# Patient Record
Sex: Male | Born: 1975 | Hispanic: No | Smoking: Current every day smoker
Health system: Southern US, Community
[De-identification: ages and names within clinical notes are randomized; demographics above are authoritative.]

## PROBLEM LIST (undated history)

## (undated) DIAGNOSIS — K2210 Ulcer of esophagus without bleeding: Secondary | ICD-10-CM

## (undated) DIAGNOSIS — M2550 Pain in unspecified joint: Secondary | ICD-10-CM

## (undated) DIAGNOSIS — E785 Hyperlipidemia, unspecified: Secondary | ICD-10-CM

## (undated) DIAGNOSIS — I10 Essential (primary) hypertension: Secondary | ICD-10-CM

## (undated) DIAGNOSIS — K219 Gastro-esophageal reflux disease without esophagitis: Secondary | ICD-10-CM

## (undated) DIAGNOSIS — A4902 Methicillin resistant Staphylococcus aureus infection, unspecified site: Secondary | ICD-10-CM

## (undated) DIAGNOSIS — M549 Dorsalgia, unspecified: Secondary | ICD-10-CM

## (undated) HISTORY — PX: SHOULDER SURGERY: SHX246

## (undated) HISTORY — PX: KNEE SURGERY: SHX244

## (undated) HISTORY — DX: Pain in unspecified joint: M25.50

## (undated) HISTORY — DX: Methicillin resistant Staphylococcus aureus infection, unspecified site: A49.02

## (undated) HISTORY — DX: Dorsalgia, unspecified: M54.9

## (undated) HISTORY — DX: Hyperlipidemia, unspecified: E78.5

## (undated) HISTORY — DX: Gastro-esophageal reflux disease without esophagitis: K21.9

---

## 2005-09-03 ENCOUNTER — Emergency Department (HOSPITAL_COMMUNITY): Admission: EM | Admit: 2005-09-03 | Discharge: 2005-09-04 | Payer: Self-pay | Admitting: Emergency Medicine

## 2005-11-16 ENCOUNTER — Emergency Department (HOSPITAL_COMMUNITY): Admission: EM | Admit: 2005-11-16 | Discharge: 2005-11-16 | Payer: Self-pay | Admitting: Emergency Medicine

## 2005-11-17 ENCOUNTER — Ambulatory Visit (HOSPITAL_COMMUNITY): Admission: RE | Admit: 2005-11-17 | Discharge: 2005-11-17 | Payer: Self-pay | Admitting: Emergency Medicine

## 2005-12-01 ENCOUNTER — Emergency Department (HOSPITAL_COMMUNITY): Admission: EM | Admit: 2005-12-01 | Discharge: 2005-12-01 | Payer: Self-pay | Admitting: Emergency Medicine

## 2005-12-19 ENCOUNTER — Emergency Department (HOSPITAL_COMMUNITY): Admission: EM | Admit: 2005-12-19 | Discharge: 2005-12-19 | Payer: Self-pay | Admitting: Emergency Medicine

## 2006-02-12 ENCOUNTER — Ambulatory Visit: Payer: Self-pay | Admitting: Internal Medicine

## 2006-02-12 ENCOUNTER — Observation Stay (HOSPITAL_COMMUNITY): Admission: EM | Admit: 2006-02-12 | Discharge: 2006-02-13 | Payer: Self-pay | Admitting: Emergency Medicine

## 2006-02-16 ENCOUNTER — Emergency Department (HOSPITAL_COMMUNITY): Admission: EM | Admit: 2006-02-16 | Discharge: 2006-02-16 | Payer: Self-pay | Admitting: Emergency Medicine

## 2006-02-24 ENCOUNTER — Emergency Department (HOSPITAL_COMMUNITY): Admission: EM | Admit: 2006-02-24 | Discharge: 2006-02-24 | Payer: Self-pay | Admitting: Emergency Medicine

## 2006-04-18 ENCOUNTER — Emergency Department (HOSPITAL_COMMUNITY): Admission: EM | Admit: 2006-04-18 | Discharge: 2006-04-18 | Payer: Self-pay | Admitting: Emergency Medicine

## 2006-05-09 ENCOUNTER — Emergency Department (HOSPITAL_COMMUNITY): Admission: EM | Admit: 2006-05-09 | Discharge: 2006-05-09 | Payer: Self-pay | Admitting: Emergency Medicine

## 2006-10-19 ENCOUNTER — Emergency Department (HOSPITAL_COMMUNITY): Admission: EM | Admit: 2006-10-19 | Discharge: 2006-10-19 | Payer: Self-pay | Admitting: Emergency Medicine

## 2007-01-03 ENCOUNTER — Emergency Department (HOSPITAL_COMMUNITY): Admission: EM | Admit: 2007-01-03 | Discharge: 2007-01-03 | Payer: Self-pay | Admitting: Emergency Medicine

## 2007-01-12 ENCOUNTER — Emergency Department (HOSPITAL_COMMUNITY): Admission: EM | Admit: 2007-01-12 | Discharge: 2007-01-13 | Payer: Self-pay | Admitting: Emergency Medicine

## 2007-04-20 ENCOUNTER — Emergency Department (HOSPITAL_COMMUNITY): Admission: EM | Admit: 2007-04-20 | Discharge: 2007-04-21 | Payer: Self-pay | Admitting: Emergency Medicine

## 2007-06-11 ENCOUNTER — Emergency Department (HOSPITAL_COMMUNITY): Admission: EM | Admit: 2007-06-11 | Discharge: 2007-06-11 | Payer: Self-pay | Admitting: Emergency Medicine

## 2007-10-10 ENCOUNTER — Emergency Department (HOSPITAL_COMMUNITY): Admission: EM | Admit: 2007-10-10 | Discharge: 2007-10-11 | Payer: Self-pay | Admitting: Emergency Medicine

## 2008-02-29 ENCOUNTER — Emergency Department (HOSPITAL_COMMUNITY): Admission: EM | Admit: 2008-02-29 | Discharge: 2008-02-29 | Payer: Self-pay | Admitting: Emergency Medicine

## 2008-07-29 ENCOUNTER — Emergency Department (HOSPITAL_COMMUNITY): Admission: EM | Admit: 2008-07-29 | Discharge: 2008-07-29 | Payer: Self-pay | Admitting: Emergency Medicine

## 2008-12-11 ENCOUNTER — Emergency Department (HOSPITAL_COMMUNITY): Admission: EM | Admit: 2008-12-11 | Discharge: 2008-12-11 | Payer: Self-pay | Admitting: Emergency Medicine

## 2009-03-24 ENCOUNTER — Emergency Department (HOSPITAL_COMMUNITY): Admission: EM | Admit: 2009-03-24 | Discharge: 2009-03-24 | Payer: Self-pay | Admitting: Emergency Medicine

## 2009-03-25 ENCOUNTER — Inpatient Hospital Stay (HOSPITAL_COMMUNITY): Admission: EM | Admit: 2009-03-25 | Discharge: 2009-03-26 | Payer: Self-pay | Admitting: Emergency Medicine

## 2009-04-16 ENCOUNTER — Emergency Department (HOSPITAL_COMMUNITY): Admission: EM | Admit: 2009-04-16 | Discharge: 2009-04-16 | Payer: Self-pay | Admitting: Emergency Medicine

## 2009-04-18 ENCOUNTER — Emergency Department (HOSPITAL_COMMUNITY): Admission: EM | Admit: 2009-04-18 | Discharge: 2009-04-18 | Payer: Self-pay | Admitting: Emergency Medicine

## 2009-04-20 ENCOUNTER — Emergency Department (HOSPITAL_COMMUNITY): Admission: EM | Admit: 2009-04-20 | Discharge: 2009-04-20 | Payer: Self-pay | Admitting: Emergency Medicine

## 2009-05-27 ENCOUNTER — Inpatient Hospital Stay (HOSPITAL_COMMUNITY): Admission: EM | Admit: 2009-05-27 | Discharge: 2009-05-28 | Payer: Self-pay | Admitting: Emergency Medicine

## 2009-05-27 ENCOUNTER — Ambulatory Visit: Payer: Self-pay | Admitting: Gastroenterology

## 2009-05-28 ENCOUNTER — Ambulatory Visit: Payer: Self-pay | Admitting: Gastroenterology

## 2009-06-27 ENCOUNTER — Encounter: Payer: Self-pay | Admitting: Gastroenterology

## 2009-07-27 DIAGNOSIS — J45909 Unspecified asthma, uncomplicated: Secondary | ICD-10-CM | POA: Insufficient documentation

## 2009-07-27 DIAGNOSIS — Z8719 Personal history of other diseases of the digestive system: Secondary | ICD-10-CM

## 2009-07-27 DIAGNOSIS — K219 Gastro-esophageal reflux disease without esophagitis: Secondary | ICD-10-CM

## 2009-07-27 DIAGNOSIS — K208 Other esophagitis: Secondary | ICD-10-CM

## 2009-07-27 DIAGNOSIS — Z9189 Other specified personal risk factors, not elsewhere classified: Secondary | ICD-10-CM | POA: Insufficient documentation

## 2009-08-27 ENCOUNTER — Emergency Department (HOSPITAL_COMMUNITY): Admission: EM | Admit: 2009-08-27 | Discharge: 2009-08-27 | Payer: Self-pay | Admitting: Emergency Medicine

## 2010-12-04 ENCOUNTER — Emergency Department (HOSPITAL_COMMUNITY): Payer: Medicaid Other

## 2010-12-04 ENCOUNTER — Emergency Department (HOSPITAL_COMMUNITY)
Admission: EM | Admit: 2010-12-04 | Discharge: 2010-12-04 | Disposition: A | Discharge status: Home / Self Care | Payer: Medicaid Other | Attending: Emergency Medicine | Admitting: Emergency Medicine

## 2010-12-04 DIAGNOSIS — R509 Fever, unspecified: Secondary | ICD-10-CM | POA: Insufficient documentation

## 2010-12-04 DIAGNOSIS — R002 Palpitations: Secondary | ICD-10-CM | POA: Insufficient documentation

## 2010-12-04 LAB — BASIC METABOLIC PANEL
BUN: 10 mg/dL (ref 6–23)
CO2: 25 mEq/L (ref 19–32)
Calcium: 9.7 mg/dL (ref 8.4–10.5)
GFR calc Af Amer: >60 mL/min (ref >60)
GFR calc non Af Amer: >60 mL/min (ref >60)
Glucose, Bld: 142 mg/dL — ABNORMAL HIGH (ref 70–99)
Potassium: 3.8 mEq/L (ref 3.5–5.1)
Sodium: 135 mEq/L (ref 135–145)

## 2010-12-04 LAB — CBC
HCT: 40.9 % (ref 39.0–52.0)
Hemoglobin: 14.3 g/dL (ref 13.0–17.0)
MCH: 27.6 pg (ref 26.0–34.0)
MCHC: 35 g/dL (ref 30.0–36.0)
MCV: 79 fL (ref 78.0–100.0)
RBC: 5.18 MIL/uL (ref 4.22–5.81)
RDW: 13.9 % (ref 11.5–15.5)
WBC: 6 10*3/uL (ref 4.0–10.5)

## 2010-12-04 LAB — DIFFERENTIAL
Basophils Absolute: 0 10*3/uL (ref 0.0–0.1)
Eosinophils Relative: 2 % (ref 0–5)
Lymphocytes Relative: 46 % (ref 12–46)
Lymphs Abs: 2.8 10*3/uL (ref 0.7–4.0)
Monocytes Absolute: 0.5 10*3/uL (ref 0.1–1.0)
Monocytes Relative: 9 % (ref 3–12)
Neutro Abs: 2.6 10*3/uL (ref 1.7–7.7)
Neutrophils Relative %: 44 % (ref 43–77)

## 2010-12-04 LAB — POCT CARDIAC MARKERS
CKMB, poc: 1.3 ng/mL (ref 1.0–8.0)
Troponin i, poc: <0.05 ng/mL (ref 0.00–0.09)

## 2010-12-10 LAB — URINALYSIS, ROUTINE W REFLEX MICROSCOPIC
Glucose, UA: NEGATIVE mg/dL
Hgb urine dipstick: NEGATIVE
Ketones, ur: NEGATIVE mg/dL
Nitrite: NEGATIVE
Protein, ur: NEGATIVE mg/dL
Specific Gravity, Urine: 1.02 (ref 1.005–1.030)
Urobilinogen, UA: 0.2 mg/dL (ref 0.0–1.0)
pH: 5.5 (ref 5.0–8.0)

## 2010-12-14 LAB — CBC
HCT: 36.3 % — ABNORMAL LOW (ref 39.0–52.0)
Hemoglobin: 12.9 g/dL — ABNORMAL LOW (ref 13.0–17.0)
Hemoglobin: 13.3 g/dL (ref 13.0–17.0)
MCHC: 35.5 g/dL (ref 30.0–36.0)
MCV: 82.9 fL (ref 78.0–100.0)
Platelets: 191 10*3/uL (ref 150–400)
RBC: 4.37 MIL/uL (ref 4.22–5.81)
RBC: 4.55 MIL/uL (ref 4.22–5.81)
RDW: 14.2 % (ref 11.5–15.5)
WBC: 5.9 10*3/uL (ref 4.0–10.5)
WBC: 7 10*3/uL (ref 4.0–10.5)

## 2010-12-14 LAB — HEPATIC FUNCTION PANEL
ALT: 39 U/L (ref 0–53)
AST: 17 U/L (ref 0–37)
Albumin: 3.8 g/dL (ref 3.5–5.2)
Alkaline Phosphatase: 62 U/L (ref 39–117)
Bilirubin, Direct: <0.1 mg/dL (ref 0.0–0.3)
Total Bilirubin: 0.4 mg/dL (ref 0.3–1.2)
Total Protein: 6.6 g/dL (ref 6.0–8.3)

## 2010-12-14 LAB — DIFFERENTIAL
Basophils Absolute: 0 10*3/uL (ref 0.0–0.1)
Basophils Relative: 0 % (ref 0–1)
Basophils Relative: 1 % (ref 0–1)
Eosinophils Absolute: 0.1 10*3/uL (ref 0.0–0.7)
Eosinophils Absolute: 0.1 10*3/uL (ref 0.0–0.7)
Eosinophils Relative: 1 % (ref 0–5)
Eosinophils Relative: 2 % (ref 0–5)
Lymphocytes Relative: 36 % (ref 12–46)
Lymphs Abs: 2.1 10*3/uL (ref 0.7–4.0)
Monocytes Absolute: 0.5 10*3/uL (ref 0.1–1.0)
Monocytes Relative: 5 % (ref 3–12)
Monocytes Relative: 9 % (ref 3–12)
Neutro Abs: 3.1 10*3/uL (ref 1.7–7.7)
Neutrophils Relative %: 53 % (ref 43–77)
Neutrophils Relative %: 63 % (ref 43–77)

## 2010-12-14 LAB — COMPREHENSIVE METABOLIC PANEL
ALT: 38 U/L (ref 0–53)
Alkaline Phosphatase: 61 U/L (ref 39–117)
CO2: 28 mEq/L (ref 19–32)
GFR calc non Af Amer: >60 mL/min (ref >60)
Glucose, Bld: 100 mg/dL — ABNORMAL HIGH (ref 70–99)
Potassium: 4 mEq/L (ref 3.5–5.1)
Sodium: 137 mEq/L (ref 135–145)

## 2010-12-14 LAB — BASIC METABOLIC PANEL
BUN: 11 mg/dL (ref 6–23)
CO2: 29 mEq/L (ref 19–32)
Calcium: 9.4 mg/dL (ref 8.4–10.5)
Chloride: 103 mEq/L (ref 96–112)
Creatinine, Ser: 0.63 mg/dL (ref 0.4–1.5)
GFR calc Af Amer: >60 mL/min (ref >60)
GFR calc non Af Amer: >60 mL/min (ref >60)
Glucose, Bld: 100 mg/dL — ABNORMAL HIGH (ref 70–99)
Potassium: 3.5 mEq/L (ref 3.5–5.1)
Sodium: 138 mEq/L (ref 135–145)

## 2010-12-14 LAB — HEMOGLOBIN AND HEMATOCRIT, BLOOD
HCT: 35.5 % — ABNORMAL LOW (ref 39.0–52.0)
Hemoglobin: 12.3 g/dL — ABNORMAL LOW (ref 13.0–17.0)

## 2010-12-14 LAB — APTT: aPTT: 31 seconds (ref 24–37)

## 2010-12-14 LAB — PROTIME-INR
INR: 0.8 (ref 0.00–1.49)
Prothrombin Time: 11.3 seconds — ABNORMAL LOW (ref 11.6–15.2)

## 2010-12-14 LAB — TYPE AND SCREEN: ABO/RH(D): O POS

## 2010-12-15 LAB — CULTURE, ROUTINE-ABSCESS

## 2010-12-16 LAB — DIFFERENTIAL
Basophils Absolute: 0 10*3/uL (ref 0.0–0.1)
Basophils Relative: 0 % (ref 0–1)
Eosinophils Absolute: 0 10*3/uL (ref 0.0–0.7)
Eosinophils Absolute: 0.1 10*3/uL (ref 0.0–0.7)
Eosinophils Relative: 2 % (ref 0–5)
Lymphs Abs: 2.7 10*3/uL (ref 0.7–4.0)
Monocytes Absolute: 0.5 10*3/uL (ref 0.1–1.0)
Monocytes Relative: 8 % (ref 3–12)
Neutro Abs: 8.9 10*3/uL — ABNORMAL HIGH (ref 1.7–7.7)
Neutrophils Relative %: 71 % (ref 43–77)

## 2010-12-16 LAB — CBC
HCT: 38 % — ABNORMAL LOW (ref 39.0–52.0)
Hemoglobin: 13.2 g/dL (ref 13.0–17.0)
MCHC: 34.8 g/dL (ref 30.0–36.0)
MCV: 82.8 fL (ref 78.0–100.0)
MCV: 83.3 fL (ref 78.0–100.0)
Platelets: 191 10*3/uL (ref 150–400)
RBC: 4.56 MIL/uL (ref 4.22–5.81)
RDW: 13.7 % (ref 11.5–15.5)
WBC: 6.6 10*3/uL (ref 4.0–10.5)

## 2010-12-16 LAB — BASIC METABOLIC PANEL
BUN: 11 mg/dL (ref 6–23)
BUN: 8 mg/dL (ref 6–23)
CO2: 26 mEq/L (ref 19–32)
Calcium: 8.8 mg/dL (ref 8.4–10.5)
Chloride: 104 mEq/L (ref 96–112)
Chloride: 105 mEq/L (ref 96–112)
Creatinine, Ser: 0.73 mg/dL (ref 0.4–1.5)
Glucose, Bld: 193 mg/dL — ABNORMAL HIGH (ref 70–99)
Potassium: 3.3 mEq/L — ABNORMAL LOW (ref 3.5–5.1)
Sodium: 140 mEq/L (ref 135–145)

## 2011-01-22 NOTE — H&P (Signed)
Jared Flowers, Jared Flowers             ACCOUNT NO.:  [id number]   MEDICAL RECORD NO.:  [id number]          PATIENT TYPE:  INP   LOCATION:  IC02                          FACILITY:  [facility]   PHYSICIAN:  Arne Cleveland, MD       DATE OF BIRTH:  01/17/1976   DATE OF ADMISSION:  03/24/2009  DATE OF DISCHARGE:  LH                              HISTORY & PHYSICAL   CHIEF COMPLAINT:  Right forearm insect bite, pain and infection.   HISTORY OF PRESENT ILLNESS:  This is a second visit to the emergency  room for a 35 year old African American male who noted a papular red  area on mid right forearm in the volar aspect of the right arm,  approximately 3 days prior to coming to the emergency room.  The patient  states that the area of the redness got worse in the arm, it began to  swell and started hurting, got progressively worse, came to the  emergency room, was put on an antibiotic and sent home.  After an  attempted I&D of the papule which had now got more indurated and red, no  fluid was collected and the patient was started on p.o. antibiotics with  doxycycline.  He went home and the arm got worse, more swollen.  The  area got red and started spreading up further, passed the area where  they circled it and his arm began feeling tingly and he was not able to  use his forearm as well.  Because it was getting worse, the patient  returned to the emergency room.  He was seen in the emergency room,  treated with some vancomycin, but the patient needed some more IV  antibiotics so, we were contacted to evaluate and admit as necessary.   PAST MEDICAL HISTORY:  Negative for any medical problems.   PAST SURGICAL HISTORY:  He had some type of surgery in the popliteal  fossa on the right knee several years back.   He has no known drug allergies.   He takes no medicine.   SOCIAL HISTORY:  He is single.  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  He  smokes a pack per day and has been smoking for the past 15 years.   He  drinks alcohol on social occasions.  Denies any drug use.   FAMILY HISTORY:  His father died of lung cancer.  Mother is in good  health.  She is in her late 7s.  He has 13 brothers and sisters and  they are all in good health.   PHYSICAL EXAMINATION:  GENERAL:  He is well-developed, well-nourished  [place] American male who appears stated age and is in no acute  distress.  VITAL SIGNS:  Blood pressure is 126/80, pulse 90, respirations 20,  temperature 97.6.  HEAD:  Atraumatic, normocephalic.  EYES:  Pupils equal, round, reactive to light.  There is sharp  extraocular muscle.  Range of motion is full.  EARS:  External ear canals and tympanic membranes appear normal.  OROPHARYNX:  Normal.  NECK:  Supple without jugular venous distention, thyromegaly, or thyroid  mass.  CHEST:  Normal to inspection and palpation.  LUNGS:  Clear to auscultation and percussion.  HEART:  Regular rhythm and rate.  Normal S1, S2 without murmur, gallop,  or rub.  ABDOMEN:  Soft, nontender with normoactive bowel sounds.  No  hepatomegaly.  No splenomegaly.  No palpable mass.  GENITALIA:  Normal.  RECTAL:  Deferred.  MUSCULOSKELETAL:  Normal.  SKIN:  In the right mid forearm on the volar area or the palmar area of  the hand, there is a red induration papule, pustule-type appearing area  in the mid right forearm which has been glanced with a needle the night  before.  Apparently, the red area has spread beyond the area where they  outlined it with a pen yesterday on his ER visit.  The patient also is  unable to give a strong squeeze and he has no sensory deficit that I can  appreciate.  The patient complaints of the arm feeling tingly and numb.  NEUROLOGIC:  Other than paresthesia in his right arm and hand, his  neurological exam is normal.  LYMPH NODE:  Normal.  PSYCHIATRIC:  Normal.   My impression is right forearm cellulitis post inset bite.   My plan is to start the patient on IV vancomycin and  I have pharmacy  dose, start the patient on IV hydration and monitor the patient's course  for further evaluation and treatment as indicate by hospital course.  The patient will be admitted to the [facility], the P team.      Arne Cleveland, MD  Electronically Signed     ML/MEDQ  D:  03/25/2009  T:  03/25/2009  Job:  [id number]
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
## 2011-01-25 NOTE — Discharge Summary (Signed)
NAMEHARLIN, Jared Flowers             ACCOUNT NO.:  [id number]   MEDICAL RECORD NO.:  [id number]          PATIENT TYPE:  INP   LOCATION:  A311                          FACILITY:  [facility]   PHYSICIAN:  Osvaldo Shipper, MD     DATE OF BIRTH:  [birth year]   DATE OF ADMISSION:  02/11/2006  DATE OF DISCHARGE:  06/07/2007LH                                 DISCHARGE SUMMARY   The patient does not have a primary care doctor.  He has been assigned to  the [facility] as he does not have insurance.   DISCHARGE DIAGNOSES:  1.  Erosive esophagitis.  2.  Asthma, stable.   Please review H&P dictated at the time of admission for details regarding  patient's presenting illness.   BRIEF HOSPITAL COURSE:  Briefly, this is a 35 year old male who has a  history of asthma, past history of H. pylori, who was doing well until about  two days prior to admission when he experienced pain in his epigastric area.  He also had nausea and vomiting.  After initial episodes of vomiting, he  vomited some blood.  At that time he presented to the ED.  He reported no  endoscopies in the past.  He denied any alarming signs and symptoms.  The  patient was admitted to the hospital, put on IV PPI.  Gastroenterology was  consulted, who did an endoscopy on him and it essentially revealed erosive  reflux esophagitis with possible trivial upper GI bleed.  The patient was  put on PPI b.i.d. for 1 month.  Amylase, lipase levels were also checked,  which were unremarkable.  The patient has been strongly counseled to quit  smoking and alcohol use.  He has been asked to be compliant with his  medications.   He has been asked to follow up with the health department for his asthma  care as well until he gets insurance and is able to see a PMD.  I am going  to prescribe him some Advair in the meantime.  The patient is very keen on  going home today and there is no reason why he cannot as his symptoms have  resolved and his exam findings have also much improved.   DISCHARGE MEDICATIONS:  1.  AcipHex 20 mg p.o. b.i.d. 1 month.  2.  Advair Diskus 100/50 mcg 1 inhaled b.i.d.   OTHER INSTRUCTIONS:  Patient not to return to work until June 9.  Patient to  avoid alcohol and smoking use for now.   CONSULTATIONS OBTAINED:  From GI.   PROCEDURES PERFORMED:  Diagnostic EGD.  Please review dictated report.      Osvaldo Shipper, MD  Electronically Signed     GK/MEDQ  D:  02/13/2006  T:  02/13/2006  Job:  [id number]   cc:   R. Roetta Sessions, M.D.  [address]  [place]  [place] [address]

## 2011-01-25 NOTE — Op Note (Signed)
NAME:  Crepeau, Zebedee             ACCOUNT NO.:  [id number]   MEDICAL RECORD NO.:  [id number]          PATIENT TYPE:  INP   LOCATION:  A311                          [facility]ACILITY:  [facility]   PHYSICIAN:  R. Roetta Sessions, M.D. DATE OF BIRTH:  [birth year]   DATE OF PROCEDURE:  02/12/2006  DATE OF DISCHARGE:                                  PROCEDURE NOTE   INDICATIONS FOR PROCEDURE:  Patient is a 35 year old gentleman admitted to  the hospital with an exacerbation of epigastric pain 8/10 with some nausea  and hematemesis.  He has remained hemodynamically stable.  H&H 12.8 and  37.8.  Platelet count 191,000.  EGD now being done. The approach has been  discussed at length including the potential risks, benefits, alternatives  have been reviewed, questions answered.  He is agreeable.  Please see  documentation in medical record.   PROCEDURE NOTE:  O2 saturation, blood pressure, pulse, and respirations were  monitored throughout the entire procedure.   CONSCIOUS SEDATION:  Versed 3 mg IV and Demerol 50 mg IV in divided doses.  Cetacaine spray for topical pharyngeal anesthesia.   FINDINGS:  Examination of the tubular esophagus revealed two 3 cm in length  distal esophageal erosions.  There was no tear, no varices, or other  abnormalities.  EG junction was easily traversed.   Stomach:  Gastric cavity was empty and insufflated well with air. Thorough  examination of the gastric mucosa in the retroflexed view of the proximal  stomach and esophagogastric junction demonstrated only a small hiatal  hernia. Pylorus patent and easily traversed. Examination of bulb and second  portion revealed no abnormalities.   THERAPEUTIC/DIAGNOSTIC MANEUVERS:  None.   The patient tolerated the procedure well.   ENDOSCOPIC IMPRESSION:  Distal esophageal erosions consistent with erosive  reflux esophagitis, otherwise normal esophagus, small hiatal hernia,  otherwise stomach D1 and D2.   I suspect the patient  suffered a relatively trivial upper GI bleed secondary  to active erosive reflux esophagitis.   RECOMMENDATIONS:  1.  Twice daily PPI in the way of Protonix 40 mg orally q.12h. x1 month and      drop back to once daily thereafter.  2.  Advance to a low fat diet.  3.  Hopefully will be able to go home soon.      Jonathon Bellows, M.D.  Electronically Signed     RMR/MEDQ  D:  02/12/2006  T:  02/12/2006  Job:  [id number]
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
## 2011-03-05 ENCOUNTER — Encounter: Payer: Self-pay | Admitting: Orthopedic Surgery

## 2011-03-05 ENCOUNTER — Ambulatory Visit (INDEPENDENT_AMBULATORY_CARE_PROVIDER_SITE_OTHER): Payer: Medicaid Other | Admitting: Orthopedic Surgery

## 2011-03-05 VITALS — Resp 18 | Ht 70.0 in | Wt 224.0 lb

## 2011-03-05 DIAGNOSIS — M25569 Pain in unspecified knee: Secondary | ICD-10-CM

## 2011-03-05 DIAGNOSIS — M9420 Chondromalacia, unspecified site: Secondary | ICD-10-CM

## 2011-03-05 DIAGNOSIS — M25561 Pain in right knee: Secondary | ICD-10-CM

## 2011-03-05 NOTE — Patient Instructions (Signed)
Go for Physical therapy  Come back in 6 weeks to recheck right knee  Take Lodine daily

## 2011-03-11 ENCOUNTER — Encounter: Payer: Self-pay | Admitting: Orthopedic Surgery

## 2011-03-11 DIAGNOSIS — M9420 Chondromalacia, unspecified site: Secondary | ICD-10-CM | POA: Insufficient documentation

## 2011-03-11 NOTE — Progress Notes (Signed)
X-ray RIGHT knee  3 views of the knees show no arthritic changes no joint malalignment no loose bodies were fusion  Impression normal knee filled

## 2011-03-11 NOTE — Progress Notes (Signed)
   This is a 35 year old male referred to [place] for bilateral knee pain RIGHT greater than LEFT he presents with knee pain since April of last year which came on gradually not associated with any injury unrelieved by 6 months of diclofenac.  He reports stabbing pain 8/10 which tends to come and go after exercise somewhat relieved by Tylox associated with some numbness tingling and locking.  The patient has an extensive review of systems with weight gain and fatigue redness in his eyes chest pain snoring GI symptoms of heartburn constipation, he has urgency listed difficulty urinating listed.  He has neurologic symptoms of tingling as well as anxiety depression excessive thirst and seasonal ALLERGY.  His symptoms definitely do not match his films  Vital signs are stable as recorded  General appearance is normal  The patient is alert and oriented x3  The patient's mood and affect are normal  The patient is ambulating with a normal gait pattern  The cardiovascular exam reveals normal pulses and temperature without edema swelling.  The lymphatic system is negative for palpable lymph nodes  The sensory exam is normal.  There are no pathologic reflexes.  Balance is normal.  Body area: Inspection Range of motion Stability Strength Skin Knee examination is as follows he has a normal ambulation pattern his infection is benign he has full range of motion joint stability is normal strength is normal McMurry signs are negative  Impression mild anterior knee pain perhaps some mild arthritic changes  Recommend physical therapy for 6 weeks.

## 2011-03-20 ENCOUNTER — Ambulatory Visit (HOSPITAL_COMMUNITY)
Admission: RE | Admit: 2011-03-20 | Discharge: 2011-03-20 | Disposition: A | Discharge status: Home / Self Care | Payer: Medicaid Other | Source: Ambulatory Visit | Attending: Orthopedic Surgery | Admitting: Orthopedic Surgery

## 2011-03-20 DIAGNOSIS — M25669 Stiffness of unspecified knee, not elsewhere classified: Secondary | ICD-10-CM | POA: Insufficient documentation

## 2011-03-20 DIAGNOSIS — M25569 Pain in unspecified knee: Secondary | ICD-10-CM | POA: Insufficient documentation

## 2011-03-20 DIAGNOSIS — IMO0001 Reserved for inherently not codable concepts without codable children: Secondary | ICD-10-CM | POA: Insufficient documentation

## 2011-03-20 DIAGNOSIS — R262 Difficulty in walking, not elsewhere classified: Secondary | ICD-10-CM | POA: Insufficient documentation

## 2011-03-20 DIAGNOSIS — M6281 Muscle weakness (generalized): Secondary | ICD-10-CM | POA: Insufficient documentation

## 2011-03-22 ENCOUNTER — Inpatient Hospital Stay (HOSPITAL_COMMUNITY)
Admission: RE | Admit: 2011-03-22 | Discharge: 2011-03-22 | Payer: Medicaid Other | Source: Ambulatory Visit | Attending: Physical Therapy | Admitting: Physical Therapy

## 2011-03-22 ENCOUNTER — Telehealth (HOSPITAL_COMMUNITY): Payer: Self-pay

## 2011-03-22 DIAGNOSIS — M6281 Muscle weakness (generalized): Secondary | ICD-10-CM | POA: Insufficient documentation

## 2011-03-27 ENCOUNTER — Ambulatory Visit (HOSPITAL_COMMUNITY)
Admission: RE | Admit: 2011-03-27 | Discharge: 2011-03-27 | Disposition: A | Discharge status: Home / Self Care | Payer: Medicaid Other | Source: Ambulatory Visit | Attending: Family Medicine | Admitting: Family Medicine

## 2011-03-27 DIAGNOSIS — M25569 Pain in unspecified knee: Secondary | ICD-10-CM | POA: Insufficient documentation

## 2011-03-27 DIAGNOSIS — M9420 Chondromalacia, unspecified site: Secondary | ICD-10-CM | POA: Insufficient documentation

## 2011-03-27 DIAGNOSIS — M6281 Muscle weakness (generalized): Secondary | ICD-10-CM | POA: Insufficient documentation
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
## 2011-03-27 NOTE — Patient Instructions (Signed)
Educated on knee motion before standing up.  Updated HEP to include 4 way SLR, functional squats and heel and toe raises.

## 2011-03-27 NOTE — Progress Notes (Signed)
  Patient Name: Jared Flowers MRN: [id number] Today's Date: 03/27/2011 Medicaid Summary:  NOTICE OF PRIOR APPROVAL WHEN REQUEST EXCEEDS POLICY MAXIMUM 03/21/2011 Dear [name] [place] SYSTEM: The [facility] is the Outpatient Specialized Therapies utilization review contractor for the [employer] and [employer] ([employer]), the [place] agency that administers the [place]. Medicaid program. The service, amount, and frequency as specified above have been approved. While this authorization is different from the request submitted, please note that prior approval for Outpatient Specialized Therapies has been given for the maximum policy amount and/or time allowable according to the Division of Medical Assistance's Medicaid clinical coverage policy Outpatient Specialized Therapies, 10A. Recipients who have questions about this notice should call the Department of Health and Human Services' Customer Service Center, toll free at [phone], Monday-Friday, 8:00 a.m.-5:00 p.m. and request that the call be transferred to [name], Appeals and EPSDT Section. Providers should call the appropriate utilization review vendor prior approval unit. Sincerely, Prior Authorization of Specialized Therapies [facility] [phone] [name] ATTN: [name] [name] [place], [place] [address] [name] Medicaid ID #: [id number] R Service Requested: Physical Therapy Amount Approved: 3 units Frequency Approved: NA Period Approved: 03/20/2011-8/22  [name] 03/27/2011, 12:10 PM

## 2011-03-27 NOTE — Progress Notes (Addendum)
  Patient Name: Jared Flowers MRN: [id number] Today's Date: 03/27/2011  This patient was evaled by Smith Mince on 03/20/11.  Pt N/S on 03/22/11.    Lindie Roberson 03/27/2011, 10:01 AM

## 2011-03-29 ENCOUNTER — Ambulatory Visit (HOSPITAL_COMMUNITY)
Admission: RE | Admit: 2011-03-29 | Discharge: 2011-03-29 | Disposition: A | Discharge status: Home / Self Care | Payer: Medicaid Other | Source: Ambulatory Visit | Attending: Physical Therapy | Admitting: Physical Therapy
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
## 2011-03-29 NOTE — Patient Instructions (Signed)
Gave pt. Medicaid 3 visit notification to review and sign to continue with therapy.  Pt was given Kathie Rhodes Ratliff's number [phone] to call and discuss financial options.

## 2011-04-02 ENCOUNTER — Telehealth (HOSPITAL_COMMUNITY): Payer: Self-pay

## 2011-04-02 ENCOUNTER — Inpatient Hospital Stay (HOSPITAL_COMMUNITY): Admission: RE | Admit: 2011-04-02 | Payer: Self-pay | Source: Ambulatory Visit

## 2011-04-05 ENCOUNTER — Inpatient Hospital Stay (HOSPITAL_COMMUNITY)
Admission: RE | Admit: 2011-04-05 | Discharge: 2011-04-05 | Payer: Medicaid Other | Source: Ambulatory Visit | Attending: Physical Therapy | Admitting: Physical Therapy

## 2011-04-05 NOTE — Progress Notes (Signed)
  Patient Name: Jared Flowers MRN: [id number] Today's Date: 04/05/2011  Pt N/S today #2.   [name] 04/05/2011, 1:24 PM

## 2011-04-17 ENCOUNTER — Encounter: Payer: Self-pay | Admitting: Orthopedic Surgery

## 2011-04-17 ENCOUNTER — Ambulatory Visit: Payer: Medicaid Other | Admitting: Orthopedic Surgery

## 2011-04-23 ENCOUNTER — Emergency Department (HOSPITAL_COMMUNITY)
Admission: EM | Admit: 2011-04-23 | Discharge: 2011-04-23 | Disposition: A | Discharge status: Home / Self Care | Payer: Medicaid Other | Attending: Emergency Medicine | Admitting: Emergency Medicine

## 2011-04-23 ENCOUNTER — Encounter (HOSPITAL_COMMUNITY): Payer: Self-pay | Admitting: *Deleted

## 2011-04-23 DIAGNOSIS — R109 Unspecified abdominal pain: Secondary | ICD-10-CM | POA: Insufficient documentation

## 2011-04-23 DIAGNOSIS — K219 Gastro-esophageal reflux disease without esophagitis: Secondary | ICD-10-CM | POA: Insufficient documentation

## 2011-04-23 DIAGNOSIS — R51 Headache: Secondary | ICD-10-CM | POA: Insufficient documentation

## 2011-04-23 LAB — COMPREHENSIVE METABOLIC PANEL
ALT: 23 U/L (ref 0–53)
AST: 15 U/L (ref 0–37)
Albumin: 3.7 g/dL (ref 3.5–5.2)
Alkaline Phosphatase: 70 U/L (ref 39–117)
BUN: 13 mg/dL (ref 6–23)
Chloride: 104 mEq/L (ref 96–112)
Potassium: 3.8 mEq/L (ref 3.5–5.1)
Sodium: 139 mEq/L (ref 135–145)
Total Bilirubin: 0.2 mg/dL — ABNORMAL LOW (ref 0.3–1.2)
Total Protein: 7.3 g/dL (ref 6.0–8.3)

## 2011-04-23 LAB — URINALYSIS, ROUTINE W REFLEX MICROSCOPIC
Bilirubin Urine: NEGATIVE
Glucose, UA: NEGATIVE mg/dL
Hgb urine dipstick: NEGATIVE
Ketones, ur: NEGATIVE mg/dL
Nitrite: NEGATIVE
Specific Gravity, Urine: 1.02 (ref 1.005–1.030)
pH: 7 (ref 5.0–8.0)

## 2011-04-23 LAB — DIFFERENTIAL
Basophils Relative: 0 % (ref 0–1)
Eosinophils Absolute: 0.1 10*3/uL (ref 0.0–0.7)
Lymphs Abs: 2.3 10*3/uL (ref 0.7–4.0)
Monocytes Relative: 8 % (ref 3–12)
Neutro Abs: 2.3 10*3/uL (ref 1.7–7.7)
Neutrophils Relative %: 44 % (ref 43–77)

## 2011-04-23 LAB — CBC
Hemoglobin: 12.8 g/dL — ABNORMAL LOW (ref 13.0–17.0)
MCH: 28.1 pg (ref 26.0–34.0)
MCHC: 35.4 g/dL (ref 30.0–36.0)
Platelets: 172 10*3/uL (ref 150–400)
RBC: 4.56 MIL/uL (ref 4.22–5.81)

## 2011-04-23 MED ORDER — ONDANSETRON HCL 4 MG PO TABS: 4.0000 mg | ORAL_TABLET | Freq: Four times a day (QID) | ORAL | Status: AC

## 2011-04-23 MED ORDER — KETOROLAC TROMETHAMINE 30 MG/ML IJ SOLN
30.0000 mg | Freq: Once | INTRAMUSCULAR | Status: AC
  Administered 2011-04-23: 30 mg via INTRAVENOUS
  Filled 2011-04-23 (×2): qty 1

## 2011-04-23 MED ORDER — ONDANSETRON HCL 4 MG/2ML IJ SOLN
4.0000 mg | Freq: Once | INTRAMUSCULAR | Status: AC
  Administered 2011-04-23: 4 mg via INTRAVENOUS
  Filled 2011-04-23 (×2): qty 2

## 2011-04-23 MED ORDER — HYDROCODONE-ACETAMINOPHEN 5-325 MG PO TABS: 1.0000 | ORAL_TABLET | ORAL | Status: AC | PRN

## 2011-04-23 MED ORDER — SODIUM CHLORIDE 0.9 % IV BOLUS (SEPSIS)
1000.0000 mL | Freq: Once | INTRAVENOUS | Status: AC
  Administered 2011-04-23: 1000 mL via INTRAVENOUS

## 2011-04-23 NOTE — ED Notes (Signed)
Patient requested something to drink, EDP notified and ok'd the PO fluids, patient was provided Ginger ale per request

## 2011-04-23 NOTE — ED Provider Notes (Signed)
History  Scribed for Dr. Adriana Simas, the patient was seen in room 5. This chart was scribed by [name]. This patient's care was started at 7:43PM.   CSN: [id number] Arrival date & time: 04/23/2011  7:09 PM  Chief Complaint  Patient presents with  . Abdominal Pain   HPI Jared Flowers is a 35 y.o. male who presents to the Emergency Department complaining of a burning pain in left mid abdomen that began 2 hours ago while the patient was at work. The patient describes the pain as being severe for 20 minutes one hour ago, and now mild. The patient reports headache, and that he had a good appetite today but denies any fever, soreness, hematuria, vomiting, no rectal bleeding, or diarrhea. The patient reports one prior similar episode that occurred about 1 year ago, but then with hematuria (he did not see a doctor for this). The patient has no history or abdominal surgery.  PCP: Dr. Tomma Rakers  HPI ELEMENTS:  Location: left mid abdomen Onset: 2 hours ago Duration: persistent since onset Timing: constant but improving  Severity: very severe one hour ago, lasting for 20 minutes, now improving Associated symptoms: as above   Past Medical History  Diagnosis Date  . Acid reflux   . Joint pain   . Back pain     Past Surgical History  Procedure Date  . Knee surgery     right knee    Family History  Problem Relation Age of Onset  . Cancer    . Asthma    . Diabetes    . Kidney disease      History  Substance Use Topics  . Smoking status: Former Smoker    Types: Cigarettes  . Smokeless tobacco: Not on file  . Alcohol Use: Yes      Review of Systems  Constitutional: Negative for fever, chills and appetite change.  HENT: Negative for neck pain.   Gastrointestinal: Positive for abdominal pain. Negative for vomiting, diarrhea and blood in stool.  Genitourinary: Negative for hematuria and difficulty urinating.  Musculoskeletal:       Denies flank pain    Neurological: Positive for headaches.  All other systems reviewed and are negative.    Physical Exam  BP 123/82  Pulse 55  Temp(Src) 98 F (36.7 C) (Oral)  Resp 16  Ht 5' 10.5" (1.791 m)  Wt 225 lb (102.059 kg)  BMI 31.83 kg/m2  SpO2 98%  Physical Exam  Constitutional: He is oriented to person, place, and time. He appears well-developed and well-nourished.  HENT:  Head: Normocephalic.  Eyes: Right eye exhibits no discharge. Left eye exhibits no discharge.  Neck: Neck supple.  Cardiovascular: Normal rate and regular rhythm.   Pulmonary/Chest: Effort normal and breath sounds normal.  Abdominal: Soft. He exhibits no distension and no mass. There is no tenderness (flank ). There is no rebound and no guarding.  Musculoskeletal: Normal range of motion.  Neurological: He is alert and oriented to person, place, and time.  Skin: Skin is warm and dry.  Psychiatric: He has a normal mood and affect. His behavior is normal. Thought content normal.    ED Course  Procedures  OTHER DATA REVIEWED: Nursing notes, vital signs, and past medical records reviewed.   DIAGNOSTIC STUDIES: Oxygen Saturation is 97% on room air, normal by my interpretation.     LABS / RADIOLOGY:  Results for orders placed during the hospital encounter of 04/23/11  CBC      Component  Value Range   WBC 5.1  4.0 - 10.5 (K/uL)   RBC 4.56  4.22 - 5.81 (MIL/uL)   Hemoglobin 12.8 (*) 13.0 - 17.0 (g/dL)   HCT 78.2 (*) 95.6 - 52.0 (%)   MCV 79.4  78.0 - 100.0 (fL)   MCH 28.1  26.0 - 34.0 (pg)   MCHC 35.4  30.0 - 36.0 (g/dL)   RDW 21.3  08.6 - 57.8 (%)   Platelets 172  150 - 400 (K/uL)  DIFFERENTIAL      Component Value Range   Neutrophils Relative 44  43 - 77 (%)   Neutro Abs 2.3  1.7 - 7.7 (K/uL)   Lymphocytes Relative 45  12 - 46 (%)   Lymphs Abs 2.3  0.7 - 4.0 (K/uL)   Monocytes Relative 8  3 - 12 (%)   Monocytes Absolute 0.4  0.1 - 1.0 (K/uL)   Eosinophils Relative 2  0 - 5 (%)   Eosinophils Absolute  0.1  0.0 - 0.7 (K/uL)   Basophils Relative 0  0 - 1 (%)   Basophils Absolute 0.0  0.0 - 0.1 (K/uL)  COMPREHENSIVE METABOLIC PANEL      Component Value Range   Sodium 139  135 - 145 (mEq/L)   Potassium 3.8  3.5 - 5.1 (mEq/L)   Chloride 104  96 - 112 (mEq/L)   CO2 22  19 - 32 (mEq/L)   Glucose, Bld 114 (*) 70 - 99 (mg/dL)   BUN 13  6 - 23 (mg/dL)   Creatinine, Ser 4.69  0.50 - 1.35 (mg/dL)   Calcium 9.5  8.4 - 62.9 (mg/dL)   Total Protein 7.3  6.0 - 8.3 (g/dL)   Albumin 3.7  3.5 - 5.2 (g/dL)   AST 15  0 - 37 (U/L)   ALT 23  0 - 53 (U/L)   Alkaline Phosphatase 70  39 - 117 (U/L)   Total Bilirubin 0.2 (*) 0.3 - 1.2 (mg/dL)   GFR calc non Af Amer >60  >60 (mL/min)   GFR calc Af Amer >60  >60 (mL/min)  LIPASE, BLOOD      Component Value Range   Lipase 48  11 - 59 (U/L)  URINALYSIS, ROUTINE W REFLEX MICROSCOPIC      Component Value Range   Color, Urine YELLOW  YELLOW    Appearance HAZY (*) CLEAR    Specific Gravity, Urine 1.020  1.005 - 1.030    pH 7.0  5.0 - 8.0    Glucose, UA NEGATIVE  NEGATIVE (mg/dL)   Hgb urine dipstick NEGATIVE  NEGATIVE    Bilirubin Urine NEGATIVE  NEGATIVE    Ketones, ur NEGATIVE  NEGATIVE (mg/dL)   Protein, ur NEGATIVE  NEGATIVE (mg/dL)   Urobilinogen, UA 0.2  0.0 - 1.0 (mg/dL)   Nitrite NEGATIVE  NEGATIVE    Leukocytes, UA NEGATIVE  NEGATIVE     ED COURSE / COORDINATION OF CARE: Ordered: U/A, blood count, Toradol 30mg , Zofran 4mg , screening test ordered   MDM: no acute abd    DISCHARGE MEDICATIONS: New Prescriptions   HYDROCODONE-ACETAMINOPHEN (NORCO) 5-325 MG PER TABLET    Take 1-2 tablets by mouth every 4 (four) hours as needed for pain.   ONDANSETRON (ZOFRAN) 4 MG TABLET    Take 1 tablet (4 mg total) by mouth every 6 (six) hours.    Scribe Attestatio.Marland KitchenMarland KitchenI personally performed the services described in this documentation, which was scribed in my presence. The recorded information has been reviewed and considered. No  att. providers  found      Donnetta Hutching, MD 04/26/11 1343

## 2011-04-23 NOTE — ED Notes (Signed)
Pt reports sudden onset of left sided abd pain approx 1 hr ago

## 2011-07-22 ENCOUNTER — Emergency Department (HOSPITAL_COMMUNITY)
Admission: EM | Admit: 2011-07-22 | Discharge: 2011-07-22 | Disposition: A | Discharge status: Home / Self Care | Payer: Medicaid Other | Attending: Emergency Medicine | Admitting: Emergency Medicine

## 2011-07-22 ENCOUNTER — Encounter (HOSPITAL_COMMUNITY): Payer: Self-pay | Admitting: Emergency Medicine

## 2011-07-22 DIAGNOSIS — S40269A Insect bite (nonvenomous) of unspecified shoulder, initial encounter: Secondary | ICD-10-CM | POA: Insufficient documentation

## 2011-07-22 DIAGNOSIS — Z87891 Personal history of nicotine dependence: Secondary | ICD-10-CM | POA: Insufficient documentation

## 2011-07-22 DIAGNOSIS — Y92009 Unspecified place in unspecified non-institutional (private) residence as the place of occurrence of the external cause: Secondary | ICD-10-CM | POA: Insufficient documentation

## 2011-07-22 DIAGNOSIS — W57XXXA Bitten or stung by nonvenomous insect and other nonvenomous arthropods, initial encounter: Secondary | ICD-10-CM | POA: Insufficient documentation

## 2011-07-22 DIAGNOSIS — K219 Gastro-esophageal reflux disease without esophagitis: Secondary | ICD-10-CM | POA: Insufficient documentation

## 2011-07-22 MED ORDER — DOXYCYCLINE HYCLATE 100 MG PO CAPS: 100.0000 mg | ORAL_CAPSULE | Freq: Two times a day (BID) | ORAL | Status: AC

## 2011-07-22 NOTE — ED Provider Notes (Signed)
Medical screening examination/treatment/procedure(s) were performed by non-physician practitioner and as supervising physician I was immediately available for consultation/collaboration.  Nicoletta Dress. Colon Branch, MD 07/22/11 [phone]

## 2011-07-22 NOTE — ED Notes (Signed)
Small, non-engorged tick removed from pt's right posterior shoulder; redness surrounding site; c/o pain and tenderness at site.

## 2011-07-22 NOTE — ED Notes (Signed)
Alert, in no distress.

## 2011-07-22 NOTE — ED Provider Notes (Signed)
History     CSN: [id number] Arrival date & time: 07/22/2011  7:16 AM   None     Chief Complaint  Patient presents with  . Tick Removal    (Consider location/radiation/quality/duration/timing/severity/associated sxs/prior treatment) HPI Comments: Patient comes to ED requesting removal of a tick.  States that family members tried to remove a tick unsuccessfully.  He c/o pain and redness to the surrounding area.  He denies fever, rash, or joint pains  Patient is a 35 y.o. male presenting with animal bite. The history is provided by the patient.  Animal Bite  The incident occurred at an unknown time. The incident occurred at home. There is an injury to the right shoulder. The pain is mild. Incident Type: tick embedded in skin of the right shoulder. Pertinent negatives include no chest pain, no numbness, no abdominal pain, no nausea, no vomiting, no headaches, no neck pain, no focal weakness, no loss of consciousness, no tingling, no weakness, no cough and no difficulty breathing. He has been behaving normally. There were no sick contacts. He has received no recent medical care.    Past Medical History  Diagnosis Date  . Acid reflux   . Joint pain   . Back pain     Past Surgical History  Procedure Date  . Knee surgery     right knee    Family History  Problem Relation Age of Onset  . Cancer    . Asthma    . Diabetes    . Kidney disease      History  Substance Use Topics  . Smoking status: Former Smoker    Types: Cigarettes  . Smokeless tobacco: Not on file  . Alcohol Use: Yes      Review of Systems  Constitutional: Negative for fever, chills, appetite change and fatigue.  HENT: Negative for sore throat, trouble swallowing, neck pain and neck stiffness.   Respiratory: Negative for cough and shortness of breath.   Cardiovascular: Negative for chest pain.  Gastrointestinal: Negative for nausea, vomiting and abdominal pain.  Genitourinary: Negative for dysuria.    Musculoskeletal: Negative for myalgias, back pain and arthralgias.  Skin: Negative for rash.       Tick bite  Neurological: Negative for dizziness, tingling, focal weakness, loss of consciousness, weakness, numbness and headaches.  Hematological: Negative for adenopathy. Does not bruise/bleed easily.    Allergies  Review of patient's allergies indicates no known allergies.  Home Medications   Current Outpatient Rx  Name Route Sig Dispense Refill  . DICLOFENAC POTASSIUM PO Oral Take by mouth.      . OMEPRAZOLE 20 MG PO CPDR Oral Take 40 mg by mouth at bedtime.        BP 134/99  Pulse 69  Temp(Src) 97.6 F (36.4 C) (Oral)  Resp 18  Ht 5\' 10"  (1.778 m)  Wt 225 lb (102.059 kg)  BMI 32.28 kg/m2  SpO2 99%  Physical Exam  Nursing note and vitals reviewed. Constitutional: He is oriented to person, place, and time. He appears well-developed and well-nourished. No distress.  HENT:  Head: Normocephalic and atraumatic.  Mouth/Throat: Oropharynx is clear and moist.  Neck: Normal range of motion. Neck supple.  Cardiovascular: Normal rate, regular rhythm and normal heart sounds.   Pulmonary/Chest: Effort normal and breath sounds normal. No respiratory distress. He exhibits no tenderness.  Musculoskeletal: Normal range of motion. He exhibits tenderness. He exhibits no edema.  Lymphadenopathy:    He has no cervical adenopathy.  Neurological: He  is alert and oriented to person, place, and time. No cranial nerve deficit. He exhibits normal muscle tone. Coordination normal.  Skin: Skin is warm and dry.     Psychiatric: He has a normal mood and affect.    ED Course  Procedures (including critical care time)      MDM    8:17 AM patient is alert, NAD. Previous tick attached to the skin of the right posterior shoulder.  Most of the tick was removed by the nursing staff.  Remaining tick parts were removed by me using a 18g needle.  Completely removed by me.  Pt tolerated procedure  well.  I will start pt on doxy and advised him of the sx's of tick borne illnesses.  He agrees to return here if any sx's develop.       Diamone Whistler L. [place], [place] 07/22/11 [phone]

## 2011-07-22 NOTE — ED Notes (Signed)
Pt states a tick is on his rt shoulder a family member tried to get it off and was unsuccessful. Pt states the area is red and painful.

## 2011-08-02 ENCOUNTER — Other Ambulatory Visit: Payer: Self-pay

## 2011-08-02 ENCOUNTER — Emergency Department (HOSPITAL_COMMUNITY): Payer: Medicaid Other

## 2011-08-02 ENCOUNTER — Emergency Department (HOSPITAL_COMMUNITY)
Admission: EM | Admit: 2011-08-02 | Discharge: 2011-08-02 | Disposition: A | Discharge status: Home / Self Care | Payer: Medicaid Other | Attending: Emergency Medicine | Admitting: Emergency Medicine

## 2011-08-02 ENCOUNTER — Encounter (HOSPITAL_COMMUNITY): Payer: Self-pay | Admitting: *Deleted

## 2011-08-02 DIAGNOSIS — R1110 Vomiting, unspecified: Secondary | ICD-10-CM | POA: Insufficient documentation

## 2011-08-02 DIAGNOSIS — K219 Gastro-esophageal reflux disease without esophagitis: Secondary | ICD-10-CM

## 2011-08-02 DIAGNOSIS — R079 Chest pain, unspecified: Secondary | ICD-10-CM | POA: Insufficient documentation

## 2011-08-02 HISTORY — DX: Ulcer of esophagus without bleeding: K22.10

## 2011-08-02 LAB — DIFFERENTIAL
Basophils Relative: 0 % (ref 0–1)
Eosinophils Relative: 1 % (ref 0–5)
Monocytes Absolute: 0.7 10*3/uL (ref 0.1–1.0)
Monocytes Relative: 8 % (ref 3–12)
Neutro Abs: 6.5 10*3/uL (ref 1.7–7.7)

## 2011-08-02 LAB — COMPREHENSIVE METABOLIC PANEL
BUN: 9 mg/dL (ref 6–23)
CO2: 27 mEq/L (ref 19–32)
Chloride: 101 mEq/L (ref 96–112)
Creatinine, Ser: 0.92 mg/dL (ref 0.50–1.35)
GFR calc non Af Amer: >90 mL/min (ref >90)
Total Bilirubin: 0.3 mg/dL (ref 0.3–1.2)

## 2011-08-02 LAB — CBC
HCT: 39.9 % (ref 39.0–52.0)
Hemoglobin: 13.5 g/dL (ref 13.0–17.0)
MCHC: 33.8 g/dL (ref 30.0–36.0)
MCV: 80.4 fL (ref 78.0–100.0)

## 2011-08-02 LAB — POCT I-STAT TROPONIN I: Troponin i, poc: 0 ng/mL (ref 0.00–0.08)

## 2011-08-02 MED ORDER — METOCLOPRAMIDE HCL 10 MG PO TABS: 10.0000 mg | ORAL_TABLET | Freq: Four times a day (QID) | ORAL | Status: AC | PRN

## 2011-08-02 MED ORDER — GI COCKTAIL ~~LOC~~
30.0000 mL | Freq: Once | ORAL | Status: AC
  Administered 2011-08-02: 30 mL via ORAL
  Filled 2011-08-02: qty 30

## 2011-08-02 MED ORDER — FAMOTIDINE IN NACL 20-0.9 MG/50ML-% IV SOLN
20.0000 mg | Freq: Once | INTRAVENOUS | Status: AC
  Administered 2011-08-02: 20 mg via INTRAVENOUS
  Filled 2011-08-02: qty 50

## 2011-08-02 MED ORDER — KETOROLAC TROMETHAMINE 30 MG/ML IJ SOLN
30.0000 mg | Freq: Once | INTRAMUSCULAR | Status: AC
  Administered 2011-08-02: 30 mg via INTRAVENOUS
  Filled 2011-08-02: qty 1

## 2011-08-02 NOTE — ED Notes (Signed)
Pt c/o pressure like pain to center of chest. Pt states he has not been able to eat for the last few days d/t n/v. Pt denies diarrhea at this time. Pt denies radiating of cp. nad noted at this time.

## 2011-08-02 NOTE — ED Provider Notes (Signed)
History     CSN: [id number] Arrival date & time: 08/02/2011  2:25 PM    Chief Complaint  Patient presents with  . Chest Pain    HPI Pt was seen at 1530.  Per pt, c/o gradual onset and persistence of constant upper abd/lower mid-chest "pain" x2 days.  Describes the pain as radiating up and down from his upper abd to his lower neck area.  Took his prilosec without relief.  Has been assoc with intermittent vomiting after eating.  Has been eval in ED and by GI MD for same over the last several years.   Denies palpitations, no SOB/cough, no back pain, no diarrhea, no fevers, no black or blood in stools or emesis.    Past Medical History  Diagnosis Date  . Acid reflux   . Joint pain   . Back pain   . Erosive esophagitis   . Asthma     Past Surgical History  Procedure Date  . Knee surgery     right knee    Family History  Problem Relation Age of Onset  . Cancer    . Asthma    . Diabetes    . Kidney disease      History  Substance Use Topics  . Smoking status: Current Everyday Smoker -- 1.0 packs/day    Types: Cigarettes  . Smokeless tobacco: Not on file  . Alcohol Use: Yes     occasionally    Review of Systems ROS: Statement: All systems negative except as marked or noted in the HPI; Constitutional: Negative for fever and chills. ; ; Eyes: Negative for eye pain, redness and discharge. ; ; ENMT: Negative for ear pain, hoarseness, nasal congestion, sinus pressure and sore throat. ; ; Cardiovascular: +CP.  Negative for palpitations, diaphoresis, dyspnea and peripheral edema. ; ; Respiratory: Negative for cough, wheezing and stridor. ; ; Gastrointestinal: Positive for nausea, vomiting, abdominal pain.  Negative for diarrhea, blood in stool, hematemesis, jaundice and rectal bleeding. . ; ; Genitourinary: Negative for dysuria, flank pain and hematuria. ; ; Musculoskeletal: Negative for back pain and neck pain. Negative for swelling and trauma.; ; Skin: Negative for pruritus, rash,  abrasions, blisters, bruising and skin lesion.; ; Neuro: Negative for headache, lightheadedness and neck stiffness. Negative for weakness, altered level of consciousness , altered mental status, extremity weakness, paresthesias, involuntary movement, seizure and syncope.      Allergies  Review of patient's allergies indicates no known allergies.  Home Medications   Current Outpatient Rx  Name Route Sig Dispense Refill  . OMEPRAZOLE 20 MG PO CPDR Oral Take 40 mg by mouth at bedtime.      Marland Kitchen DOXYCYCLINE HYCLATE 100 MG PO CAPS Oral Take 1 capsule (100 mg total) by mouth 2 (two) times daily. For 14 days 28 capsule 0    BP 132/85  Pulse 67  Temp(Src) 98.2 F (36.8 C) (Oral)  Resp 20  Ht 5\' 10"  (1.778 m)  Wt 227 lb (102.967 kg)  BMI 32.57 kg/m2  SpO2 97%  Physical Exam 1535: Physical examination:  Nursing notes reviewed; Vital signs and O2 SAT reviewed;  Constitutional: Well developed, Well nourished, Well hydrated, In no acute distress; Head:  Normocephalic, atraumatic; Eyes: EOMI, PERRL, No scleral icterus; ENMT: Mouth and pharynx normal, Mucous membranes moist; Neck: Supple, Full range of motion, No lymphadenopathy; Cardiovascular: Regular rate and rhythm, No murmur, rub, or gallop; Respiratory: Breath sounds clear & equal bilaterally, No rales, rhonchi, wheezes, or rub, Normal respiratory effort/excursion;  Chest: Nontender, Movement normal; Abdomen: Soft, +mild mid-epigastric tenderness to palp, Nondistended, Normal bowel sounds;; Extremities: Pulses normal, No tenderness, No edema, No calf edema or asymmetry.; Neuro: AA&Ox3, Major CN grossly intact.  No gross focal motor or sensory deficits in extremities.; Skin: Color normal, Warm, Dry.    ED Course  Procedures  MDM  MDM Reviewed: previous chart, nursing note and vitals Reviewed previous: ECG Interpretation: ECG, labs and x-ray    Date: 08/02/2011  Rate: 84  Rhythm: normal sinus rhythm  QRS Axis: normal  Intervals: normal   ST/T Wave abnormalities: nonspecific T wave changes  Conduction Disutrbances:none  Narrative Interpretation:   Old EKG Reviewed: unchanged; no significant changes from previous EKG dated 12/04/2010.   Results for orders placed during the hospital encounter of 08/02/11  CBC      Component Value Range   WBC 9.2  4.0 - 10.5 (K/uL)   RBC 4.96  4.22 - 5.81 (MIL/uL)   Hemoglobin 13.5  13.0 - 17.0 (g/dL)   HCT 16.1  09.6 - 04.5 (%)   MCV 80.4  78.0 - 100.0 (fL)   MCH 27.2  26.0 - 34.0 (pg)   MCHC 33.8  30.0 - 36.0 (g/dL)   RDW 40.9  81.1 - 91.4 (%)   Platelets 193  150 - 400 (K/uL)  DIFFERENTIAL      Component Value Range   Neutrophils Relative 71  43 - 77 (%)   Neutro Abs 6.5  1.7 - 7.7 (K/uL)   Lymphocytes Relative 20  12 - 46 (%)   Lymphs Abs 1.9  0.7 - 4.0 (K/uL)   Monocytes Relative 8  3 - 12 (%)   Monocytes Absolute 0.7  0.1 - 1.0 (K/uL)   Eosinophils Relative 1  0 - 5 (%)   Eosinophils Absolute 0.1  0.0 - 0.7 (K/uL)   Basophils Relative 0  0 - 1 (%)   Basophils Absolute 0.0  0.0 - 0.1 (K/uL)  COMPREHENSIVE METABOLIC PANEL      Component Value Range   Sodium 138  135 - 145 (mEq/L)   Potassium 3.7  3.5 - 5.1 (mEq/L)   Chloride 101  96 - 112 (mEq/L)   CO2 27  19 - 32 (mEq/L)   Glucose, Bld 97  70 - 99 (mg/dL)   BUN 9  6 - 23 (mg/dL)   Creatinine, Ser 7.82  0.50 - 1.35 (mg/dL)   Calcium 9.8  8.4 - 95.6 (mg/dL)   Total Protein 7.4  6.0 - 8.3 (g/dL)   Albumin 3.9  3.5 - 5.2 (g/dL)   AST 20  0 - 37 (U/L)   ALT 30  0 - 53 (U/L)   Alkaline Phosphatase 90  39 - 117 (U/L)   Total Bilirubin 0.3  0.3 - 1.2 (mg/dL)   GFR calc non Af Amer >90  >90 (mL/min)   GFR calc Af Amer >90  >90 (mL/min)  POCT I-STAT TROPONIN I      Component Value Range   Troponin i, poc 0.00  0.00 - 0.08 (ng/mL)   Comment 3           LIPASE, BLOOD      Component Value Range   Lipase 42  11 - 59 (U/L)   Dg Chest Portable 1 View  08/02/2011  *RADIOLOGY REPORT*  Clinical Data: Midline chest pain for 5  hours  PORTABLE CHEST - 1 VIEW  Comparison: 12/04/2010  Findings: The heart size and mediastinal contours are within normal limits.  Both lungs are clear.  The visualized skeletal structures are unremarkable.  IMPRESSION: Negative exam.  Original Report Authenticated By: Rosealee Albee, M.D.   Dg Abd 2 Views  08/02/2011  *RADIOLOGY REPORT*  Clinical Data: Chest heaviness, loss of appetite, nausea and vomiting.  Evaluate for small bowel obstruction or free air.  ABDOMEN - 2 VIEW  Comparison: CT abdomen pelvis 05/27/2009.  Findings: Two views of the abdomen show gas and stool scattered in the colon.  Colon is not dilated but there are a few air-fluid levels contained within.  No small bowel dilatation.  IMPRESSION:  1.  No evidence of small bowel obstruction or free air. 2.  Scattered air fluid levels in nondilated colon.  Original Report Authenticated By: Reyes Ivan, M.D.    EGD by Dr. Kendell Bane 02/2006: ENDOSCOPIC IMPRESSION: Distal esophageal erosions consistent with erosive reflux esophagitis, otherwise normal esophagus, small hiatal hernia, otherwise stomach D1 and D2.   5:37 PM:  Improved after meds.  Tol PO well while in ED.  Doubt ACS given normal EKG and troponin with 2 days of constant symptoms.  Abd exam benign.  EGD hx as above.  Wants to go home now.  Dx testing d/w pt.  Questions answered.  Verb understanding, agreeable to d/c home with outpt f/u with his GI doctor.    Curry Dulski Allison Quarry, DO 08/03/11 2332

## 2011-08-02 NOTE — ED Notes (Signed)
Pt c/o heavy feeling in center of chest that started yesterday

## 2011-11-27 ENCOUNTER — Encounter (HOSPITAL_COMMUNITY): Payer: Self-pay | Admitting: *Deleted

## 2011-11-27 ENCOUNTER — Emergency Department (HOSPITAL_COMMUNITY)
Admission: EM | Admit: 2011-11-27 | Discharge: 2011-11-27 | Disposition: A | Discharge status: Home / Self Care | Payer: Medicaid Other | Attending: Emergency Medicine | Admitting: Emergency Medicine

## 2011-11-27 DIAGNOSIS — Z836 Family history of other diseases of the respiratory system: Secondary | ICD-10-CM | POA: Insufficient documentation

## 2011-11-27 DIAGNOSIS — J45909 Unspecified asthma, uncomplicated: Secondary | ICD-10-CM | POA: Insufficient documentation

## 2011-11-27 DIAGNOSIS — M549 Dorsalgia, unspecified: Secondary | ICD-10-CM | POA: Insufficient documentation

## 2011-11-27 DIAGNOSIS — Z841 Family history of disorders of kidney and ureter: Secondary | ICD-10-CM | POA: Insufficient documentation

## 2011-11-27 DIAGNOSIS — K209 Esophagitis, unspecified without bleeding: Secondary | ICD-10-CM | POA: Insufficient documentation

## 2011-11-27 DIAGNOSIS — K219 Gastro-esophageal reflux disease without esophagitis: Secondary | ICD-10-CM | POA: Insufficient documentation

## 2011-11-27 DIAGNOSIS — F17200 Nicotine dependence, unspecified, uncomplicated: Secondary | ICD-10-CM | POA: Insufficient documentation

## 2011-11-27 DIAGNOSIS — R197 Diarrhea, unspecified: Secondary | ICD-10-CM | POA: Insufficient documentation

## 2011-11-27 DIAGNOSIS — Z833 Family history of diabetes mellitus: Secondary | ICD-10-CM | POA: Insufficient documentation

## 2011-11-27 DIAGNOSIS — M2550 Pain in unspecified joint: Secondary | ICD-10-CM | POA: Insufficient documentation

## 2011-11-27 DIAGNOSIS — Z809 Family history of malignant neoplasm, unspecified: Secondary | ICD-10-CM | POA: Insufficient documentation

## 2011-11-27 DIAGNOSIS — R109 Unspecified abdominal pain: Secondary | ICD-10-CM | POA: Insufficient documentation

## 2011-11-27 LAB — URINALYSIS, ROUTINE W REFLEX MICROSCOPIC
Bilirubin Urine: NEGATIVE
Hgb urine dipstick: NEGATIVE
Ketones, ur: NEGATIVE mg/dL
Protein, ur: NEGATIVE mg/dL
Urobilinogen, UA: 0.2 mg/dL (ref 0.0–1.0)

## 2011-11-27 LAB — POCT I-STAT, CHEM 8
Creatinine, Ser: 0.9 mg/dL (ref 0.50–1.35)
Glucose, Bld: 124 mg/dL — ABNORMAL HIGH (ref 70–99)
Hemoglobin: 13.3 g/dL (ref 13.0–17.0)
TCO2: 24 mmol/L (ref 0–100)

## 2011-11-27 MED ORDER — KETOROLAC TROMETHAMINE 60 MG/2ML IM SOLN
60.0000 mg | Freq: Once | INTRAMUSCULAR | Status: AC
  Administered 2011-11-27: 60 mg via INTRAMUSCULAR
  Filled 2011-11-27: qty 2

## 2011-11-27 MED ORDER — NAPROXEN 500 MG PO TABS: 500.0000 mg | ORAL_TABLET | Freq: Two times a day (BID) | ORAL | Status: DC

## 2011-11-27 MED ORDER — OXYCODONE-ACETAMINOPHEN 5-325 MG PO TABS: 1.0000 | ORAL_TABLET | ORAL | Status: AC | PRN

## 2011-11-27 NOTE — ED Notes (Signed)
Pt reports generalized abdominal cramping and diarrhea.  Denies nausea or vomiting. Reports symptoms began 2 days ago, and have gotten worse since then.

## 2011-11-27 NOTE — ED Notes (Signed)
Pt resting with eyes closed, respirations regular, even and unlabored.  Pt has tolerated p.o fluids with no difficulty.

## 2011-11-27 NOTE — Discharge Instructions (Signed)
Your blood tests and urine tests are normal, please take the indication as prescribed and return if you should develop severe or worsening pain, vomiting, fevers. Often the symptoms will last several days before they resolve.  You have been diagnosed with undifferentiated abdominal pain.  Abdominal pain can be caused by many things. Your caregiver evaluates the seriousness of your pain by an examination and possibly blood or urine tests and imaging (CT scan, x-rays, ultrasound). Many cases can be observed and treated at home after initial evaluation in the emergency department. Even though you are being discharged home, abdominal pain can be unpredictable. Therefore, you need a repeat exam if your pain does not resolve, returns, or worsens. Most patient's with abdominal pain do not need to be admitted to the hospital or have surgery, but serious problems like appendicitis and gallbladder attacks can start out as nonspecific pain. Many abdominal conditions cannot be diagnosed in 1 visit, so followup evaluations are very important.  Seek immediate medical attention if:  *The pain does not go away or becomes severe. *Temperature above 101 develops *Repeated vomiting occurs(multiple episodes) *The pain becomes localized to portions of the abdomen. The right side could possibly be appendicitis. In an adult, the left lower portion of the abdomen could be colitis or diverticulitis. *Blood is being passed in stools or vomit *Return also if you develop chest pain, difficulty breathing, dizziness or fainting, or become confused poorly responsive or inconsolable (young children).     If you do not have a physician, you should reference the below phone numbers and call in the morning to establish follow up care.  RESOURCE GUIDE  Dental Problems  Patients with Medicaid: [facility] [phone] [address]                                           [phone]  [employer] Phone:  [phone]                                                  Phone:  [phone]  If unable to pay or uninsured, contact:  [facility] or [facility]. to become qualified for the adult dental clinic.  Chronic Pain Problems Contact [facility]  [phone] Patients need to be referred by their primary care doctor.  Insufficient Money for Medicine Contact [employer]:  call "211" or [facility] (503)203-8286.  No Primary Care Doctor Call [facility]  [phone] Other agencies that provide inexpensive medical care    [facility]  [phone]    [facility]  [phone]    [facility]  [phone]    [facility]  [phone]    [facility]  [phone]    [facility]  [phone]  Psychological Services [facility] Behavior[facility]  [phone] [facility]  [phone] [facility] Mental Health   [phone] (emergency services [phone])  Substance Abuse Resources [facility]  [phone] [facility] [phone] [facility] [phone] Floydene Flock [phone] Residential & Outpatient Substance Abuse Program  [phone]  Abuse/Neglect  [facility] Child Abuse Hotline 680-426-8535 [facility] Child Abuse Hotline [phone] (After Hours)  Emergency Shelter [facility] (714)816-0329  Maternity Homes [facility] at the [place] [facility] [place] 651-887-1747 [facility] [phone]  MRSA Hotline #:   203-020-5486    [facility] Resources  [facility]     [employer]                          [facility] Dept. [address] [place]                       [address]      [address]  Blondell Reveal Phone:  [phone]                                   Phone:  [phone]                  Phone:  [phone]  [facility] Mental Health Phone:  [phone]  [facility] Child Abuse Hotline 414-573-1666 [phone] (After Hours)

## 2011-11-27 NOTE — ED Provider Notes (Signed)
History     CSN: [id number]  Arrival date & time 11/27/11  0115   First MD Initiated Contact with Patient 11/27/11 0138      No chief complaint on file.   (Consider location/radiation/quality/duration/timing/severity/associated sxs/prior treatment) HPI Comments: 36 year old male with a history of no significant past medical history presents with generalized abdominal cramping and watery diarrhea for 2 days. Over the last 24 hours the diarrhea has stopped, he still has the feeling of needing to have a bowel movement but is unable to pass stool. His diarrhea was watery and nonbloody, today he is able to pass gas but no stool. He denies dysuria but has had some darkened urine. He denies nausea vomiting fevers chills back pain chest pain swelling of the legs or rashes. He states he had a child with similar diarrhea illness over the last week.  The history is provided by the patient.    Past Medical History  Diagnosis Date  . Acid reflux   . Joint pain   . Back pain   . Erosive esophagitis   . Asthma     Past Surgical History  Procedure Date  . Knee surgery     right knee    Family History  Problem Relation Age of Onset  . Cancer    . Asthma    . Diabetes    . Kidney disease      History  Substance Use Topics  . Smoking status: Current Everyday Smoker -- 1.0 packs/day    Types: Cigarettes  . Smokeless tobacco: Not on file  . Alcohol Use: No      Review of Systems  All other systems reviewed and are negative.    Allergies  Review of patient's allergies indicates no known allergies.  Home Medications   Current Outpatient Rx  Name Route Sig Dispense Refill  . NAPROXEN 500 MG PO TABS Oral Take 1 tablet (500 mg total) by mouth 2 (two) times daily with a meal. 30 tablet 0  . OMEPRAZOLE 20 MG PO CPDR Oral Take 40 mg by mouth at bedtime.      . OXYCODONE-ACETAMINOPHEN 5-325 MG PO TABS Oral Take 1 tablet by mouth every 4 (four) hours as needed for pain. May take 2  tablets PO q 6 hours for severe pain - Do not take with Tylenol as this tablet already contains tylenol 15 tablet 0    BP 133/83  Pulse 80  Temp(Src) 98.3 F (36.8 C) (Oral)  Resp 18  Ht 5\' 10"  (1.778 m)  Wt 225 lb (102.059 kg)  BMI 32.28 kg/m2  Physical Exam  Nursing note and vitals reviewed. Constitutional: He appears well-developed and well-nourished. No distress.  HENT:  Head: Normocephalic and atraumatic.  Mouth/Throat: Oropharynx is clear and moist. No oropharyngeal exudate.  Eyes: Conjunctivae and EOM are normal. Pupils are equal, round, and reactive to light. Right eye exhibits no discharge. Left eye exhibits no discharge. No scleral icterus.  Neck: Normal range of motion. Neck supple. No JVD present. No thyromegaly present.  Cardiovascular: Normal rate, regular rhythm, normal heart sounds and intact distal pulses.  Exam reveals no gallop and no friction rub.   No murmur heard. Pulmonary/Chest: Effort normal and breath sounds normal. No respiratory distress. He has no wheezes. He has no rales.  Abdominal: Soft. Bowel sounds are normal. He exhibits no distension and no mass. There is no tenderness.  Musculoskeletal: Normal range of motion. He exhibits no edema and no tenderness.  Lymphadenopathy:  He has no cervical adenopathy.  Neurological: He is alert. Coordination normal.  Skin: Skin is warm and dry. No rash noted. No erythema.  Psychiatric: He has a normal mood and affect. His behavior is normal.    ED Course  Procedures (including critical care time)  Labs Reviewed  POCT I-STAT, CHEM 8 - Abnormal; Notable for the following:    Glucose, Bld 124 (*)    Calcium, Ion 1.11 (*)    All other components within normal limits  URINALYSIS, ROUTINE W REFLEX MICROSCOPIC   No results found.   1. Abdominal cramping       MDM  Mucous membranes are moist, abdomen is soft nontender and has normal bowel sounds, there is no edema, normal heart and lung sounds. Vital  signs appear normal, there is no fever or tachycardia or hypotension. We'll proceed with intramuscular Toradol for abdominal cramping, check i-STAT chemistry after voluminous diarrhea over the last 2 days, urinalysis for urine infection.   Patient reevaluated, feels improved after intramuscular Toradol, laboratory data shows normal metabolic function, normal hemoglobin, normal renal function, normal urinalysis without ketones or proteinuria.  Patient appears well, likely has viral syndrome and is improving overall. Encouraged to followup closely with primary or return to ER for severe or worsening symptoms  Vida Roller, MD 11/27/11 [phone]

## 2012-04-14 ENCOUNTER — Ambulatory Visit (HOSPITAL_COMMUNITY): Payer: Medicaid Other | Admitting: Physical Therapy

## 2012-06-10 ENCOUNTER — Emergency Department (HOSPITAL_COMMUNITY)
Admission: EM | Admit: 2012-06-10 | Discharge: 2012-06-10 | Disposition: A | Discharge status: Home / Self Care | Payer: Medicaid Other | Attending: Emergency Medicine | Admitting: Emergency Medicine

## 2012-06-10 ENCOUNTER — Emergency Department (HOSPITAL_COMMUNITY): Payer: Medicaid Other

## 2012-06-10 ENCOUNTER — Encounter (HOSPITAL_COMMUNITY): Payer: Self-pay | Admitting: *Deleted

## 2012-06-10 DIAGNOSIS — M25521 Pain in right elbow: Secondary | ICD-10-CM

## 2012-06-10 DIAGNOSIS — S0003XA Contusion of scalp, initial encounter: Secondary | ICD-10-CM | POA: Insufficient documentation

## 2012-06-10 DIAGNOSIS — F17200 Nicotine dependence, unspecified, uncomplicated: Secondary | ICD-10-CM | POA: Insufficient documentation

## 2012-06-10 DIAGNOSIS — J45909 Unspecified asthma, uncomplicated: Secondary | ICD-10-CM | POA: Insufficient documentation

## 2012-06-10 DIAGNOSIS — M25529 Pain in unspecified elbow: Secondary | ICD-10-CM | POA: Insufficient documentation

## 2012-06-10 DIAGNOSIS — Z79899 Other long term (current) drug therapy: Secondary | ICD-10-CM | POA: Insufficient documentation

## 2012-06-10 DIAGNOSIS — Y990 Civilian activity done for income or pay: Secondary | ICD-10-CM | POA: Insufficient documentation

## 2012-06-10 DIAGNOSIS — K219 Gastro-esophageal reflux disease without esophagitis: Secondary | ICD-10-CM | POA: Insufficient documentation

## 2012-06-10 MED ORDER — OXYCODONE-ACETAMINOPHEN 5-325 MG PO TABS: ORAL_TABLET | ORAL | Status: DC

## 2012-06-10 MED ORDER — OXYCODONE-ACETAMINOPHEN 5-325 MG PO TABS
1.0000 | ORAL_TABLET | Freq: Once | ORAL | Status: AC
  Administered 2012-06-10: 1 via ORAL
  Filled 2012-06-10: qty 1

## 2012-06-10 MED ORDER — IBUPROFEN 800 MG PO TABS
800.0000 mg | ORAL_TABLET | Freq: Once | ORAL | Status: AC
Start: 2012-06-10 — End: 2012-06-10
  Administered 2012-06-10: 800 mg via ORAL
  Filled 2012-06-10: qty 1
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
## 2012-06-11 NOTE — ED Provider Notes (Signed)
Medical screening examination/treatment/procedure(s) were performed by non-physician practitioner and as supervising physician I was immediately available for consultation/collaboration.   Shelda Jakes, MD 06/11/12 [phone]

## 2012-10-10 ENCOUNTER — Emergency Department (HOSPITAL_COMMUNITY): Payer: Medicaid Other

## 2012-10-10 ENCOUNTER — Emergency Department (HOSPITAL_COMMUNITY)
Admission: EM | Admit: 2012-10-10 | Discharge: 2012-10-10 | Disposition: A | Discharge status: Home / Self Care | Payer: Medicaid Other | Attending: Emergency Medicine | Admitting: Emergency Medicine

## 2012-10-10 ENCOUNTER — Encounter (HOSPITAL_COMMUNITY): Payer: Self-pay | Admitting: *Deleted

## 2012-10-10 DIAGNOSIS — F17200 Nicotine dependence, unspecified, uncomplicated: Secondary | ICD-10-CM | POA: Insufficient documentation

## 2012-10-10 DIAGNOSIS — Z8739 Personal history of other diseases of the musculoskeletal system and connective tissue: Secondary | ICD-10-CM | POA: Insufficient documentation

## 2012-10-10 DIAGNOSIS — Y92009 Unspecified place in unspecified non-institutional (private) residence as the place of occurrence of the external cause: Secondary | ICD-10-CM | POA: Insufficient documentation

## 2012-10-10 DIAGNOSIS — M549 Dorsalgia, unspecified: Secondary | ICD-10-CM | POA: Insufficient documentation

## 2012-10-10 DIAGNOSIS — Z8719 Personal history of other diseases of the digestive system: Secondary | ICD-10-CM | POA: Insufficient documentation

## 2012-10-10 DIAGNOSIS — K219 Gastro-esophageal reflux disease without esophagitis: Secondary | ICD-10-CM | POA: Insufficient documentation

## 2012-10-10 DIAGNOSIS — S5000XA Contusion of unspecified elbow, initial encounter: Secondary | ICD-10-CM | POA: Insufficient documentation

## 2012-10-10 DIAGNOSIS — Y9389 Activity, other specified: Secondary | ICD-10-CM | POA: Insufficient documentation

## 2012-10-10 DIAGNOSIS — Z79899 Other long term (current) drug therapy: Secondary | ICD-10-CM | POA: Insufficient documentation

## 2012-10-10 DIAGNOSIS — J45909 Unspecified asthma, uncomplicated: Secondary | ICD-10-CM | POA: Insufficient documentation

## 2012-10-10 DIAGNOSIS — W010XXA Fall on same level from slipping, tripping and stumbling without subsequent striking against object, initial encounter: Secondary | ICD-10-CM | POA: Insufficient documentation

## 2012-10-10 MED ORDER — OXYCODONE-ACETAMINOPHEN 5-325 MG PO TABS: 1.0000 | ORAL_TABLET | Freq: Four times a day (QID) | ORAL | Status: AC | PRN

## 2012-10-10 MED ORDER — OXYCODONE-ACETAMINOPHEN 5-325 MG PO TABS
1.0000 | ORAL_TABLET | Freq: Once | ORAL | Status: AC
  Administered 2012-10-10: 1 via ORAL
  Filled 2012-10-10: qty 1

## 2012-10-10 MED ORDER — IBUPROFEN 800 MG PO TABS
800.0000 mg | ORAL_TABLET | Freq: Once | ORAL | Status: AC
  Administered 2012-10-10: 800 mg via ORAL
  Filled 2012-10-10: qty 1

## 2012-10-10 MED ORDER — IBUPROFEN 800 MG PO TABS: 800.0000 mg | ORAL_TABLET | Freq: Three times a day (TID) | ORAL | Status: DC

## 2012-10-10 MED ORDER — ONDANSETRON HCL 4 MG PO TABS
4.0000 mg | ORAL_TABLET | Freq: Once | ORAL | Status: AC
  Administered 2012-10-10: 4 mg via ORAL
  Filled 2012-10-10: qty 1

## 2012-10-10 NOTE — ED Provider Notes (Signed)
History     CSN: [id number]  Arrival date & time 10/10/12  1517   First MD Initiated Contact with Patient 10/10/12 1608      Chief Complaint  Patient presents with  . Elbow Pain    (Consider location/radiation/quality/duration/timing/severity/associated sxs/prior treatment) Patient is a 37 y.o. male presenting with arm injury. The history is provided by the patient.  Arm Injury  The incident occurred yesterday. The incident occurred at home. The injury mechanism was a fall. Context: slipped of wet surface. There is an injury to the left elbow. The pain is moderate. It is unlikely that a foreign body is present. Pertinent negatives include no chest pain, no numbness, no abdominal pain, no headaches, no neck pain, no seizures, no cough and no difficulty breathing. There have been no prior injuries to these areas. He is right-handed. He has been behaving normally. There were no sick contacts. He has received no recent medical care.    Past Medical History  Diagnosis Date  . Acid reflux   . Joint pain   . Back pain   . Erosive esophagitis   . Asthma     Past Surgical History  Procedure Date  . Knee surgery     right knee    Family History  Problem Relation Age of Onset  . Cancer    . Asthma    . Diabetes    . Kidney disease      History  Substance Use Topics  . Smoking status: Current Every Day Smoker -- 1.0 packs/day    Types: Cigarettes  . Smokeless tobacco: Not on file  . Alcohol Use: No      Review of Systems  Constitutional: Negative for activity change.       All ROS Neg except as noted in HPI  HENT: Negative for nosebleeds and neck pain.   Eyes: Negative for photophobia and discharge.  Respiratory: Positive for wheezing. Negative for cough and shortness of breath.   Cardiovascular: Negative for chest pain and palpitations.  Gastrointestinal: Negative for abdominal pain and blood in stool.       GERD  Genitourinary: Negative for dysuria, frequency and  hematuria.  Musculoskeletal: Positive for back pain and arthralgias.  Skin: Negative.   Neurological: Negative for dizziness, seizures, speech difficulty, numbness and headaches.  Psychiatric/Behavioral: Negative for hallucinations and confusion.    Allergies  Hydrocodone-acetaminophen  Home Medications   Current Outpatient Rx  Name  Route  Sig  Dispense  Refill  . GEMFIBROZIL 600 MG PO TABS   Oral   Take 600 mg by mouth 2 (two) times daily before a meal.         . OXYCODONE-ACETAMINOPHEN 5-325 MG PO TABS   Oral   Take 1 tablet by mouth every 6 (six) hours as needed. pain           BP 142/84  Pulse 72  Temp 98.1 F (36.7 C) (Oral)  Resp 16  Ht 5\' 11"  (1.803 m)  Wt 220 lb (99.791 kg)  BMI 30.68 kg/m2  SpO2 98%  Physical Exam  Nursing note and vitals reviewed. Constitutional: He is oriented to person, place, and time. He appears well-developed and well-nourished.  Non-toxic appearance.  HENT:  Head: Normocephalic.  Right Ear: Tympanic membrane and external ear normal.  Left Ear: Tympanic membrane and external ear normal.  Eyes: EOM and lids are normal. Pupils are equal, round, and reactive to light.  Neck: Normal range of motion. Neck supple. Carotid bruit  is not present.  Cardiovascular: Normal rate, regular rhythm, normal heart sounds, intact distal pulses and normal pulses.   Pulmonary/Chest: Breath sounds normal. No respiratory distress.  Abdominal: Soft. Bowel sounds are normal. There is no tenderness. There is no guarding.  Musculoskeletal: Normal range of motion.  Lymphadenopathy:       Head (right side): No submandibular adenopathy present.       Head (left side): No submandibular adenopathy present.    He has no cervical adenopathy.  Neurological: He is alert and oriented to person, place, and time. He has normal strength. No cranial nerve deficit or sensory deficit.  Skin: Skin is warm and dry.  Psychiatric: He has a normal mood and affect. His speech  is normal.    ED Course  Procedures (including critical care time)  Labs Reviewed - No data to display Dg Elbow Complete Right  10/10/2012  *RADIOLOGY REPORT*  Clinical Data: Fall down stairs, right elbow pain  RIGHT ELBOW - COMPLETE 3+ VIEW  Comparison: 06/10/2012  Findings: No fracture or dislocation is seen.  The joint spaces are preserved.  The visualized soft tissues are unremarkable.  No displaced elbow joint fat pads to suggest an elbow joint effusion.  IMPRESSION: No fracture or dislocation is seen.   Original Report Authenticated By: Charline Bills, M.D.      No diagnosis found.    MDM  I have reviewed nursing notes, vital signs, and all appropriate lab and imaging results for this patient. Xray of the left elbow is neg. For fracture or dislocation.Pt placed in a  Sling, and Rx for percocet and ibuprofen given. Pt to see Dr Hilda Lias if not improving.       Kathie Dike, [place] 10/10/12 [phone]

## 2012-10-10 NOTE — ED Notes (Signed)
Patient with no complaints at this time. Respirations even and unlabored. Skin warm/dry. Discharge instructions reviewed with patient at this time. Patient given opportunity to voice concerns/ask questions. Patient discharged at this time and left Emergency Department with steady gait.   

## 2012-10-10 NOTE — ED Notes (Signed)
Pt fell yesterday, landing on elbow. Pain to left elbow.

## 2012-10-11 NOTE — ED Provider Notes (Signed)
Medical screening examination/treatment/procedure(s) were performed by non-physician practitioner and as supervising physician I was immediately available for consultation/collaboration.   Luisdavid Hamblin W. Corley Kohls, MD 10/11/12 1602 

## 2012-11-25 ENCOUNTER — Other Ambulatory Visit (HOSPITAL_COMMUNITY): Payer: Self-pay | Admitting: Orthopaedic Surgery

## 2012-11-25 ENCOUNTER — Ambulatory Visit (HOSPITAL_COMMUNITY)
Admission: RE | Admit: 2012-11-25 | Discharge: 2012-11-25 | Disposition: A | Discharge status: Home / Self Care | Payer: Medicaid Other | Source: Ambulatory Visit | Attending: Orthopaedic Surgery | Admitting: Orthopaedic Surgery

## 2012-11-25 DIAGNOSIS — M79609 Pain in unspecified limb: Secondary | ICD-10-CM | POA: Insufficient documentation

## 2012-11-25 DIAGNOSIS — Z9889 Other specified postprocedural states: Secondary | ICD-10-CM | POA: Insufficient documentation

## 2012-11-25 DIAGNOSIS — M79661 Pain in right lower leg: Secondary | ICD-10-CM

## 2012-11-26 ENCOUNTER — Ambulatory Visit: Payer: Self-pay | Admitting: Family Medicine

## 2012-11-27 ENCOUNTER — Encounter: Payer: Self-pay | Admitting: *Deleted

## 2012-11-30 ENCOUNTER — Ambulatory Visit: Payer: Self-pay | Admitting: Family Medicine

## 2012-12-02 ENCOUNTER — Ambulatory Visit (INDEPENDENT_AMBULATORY_CARE_PROVIDER_SITE_OTHER): Payer: Medicaid Other | Admitting: Family Medicine

## 2012-12-02 ENCOUNTER — Encounter: Payer: Self-pay | Admitting: Family Medicine

## 2012-12-02 VITALS — BP 130/90 | Ht 71.0 in | Wt 220.0 lb

## 2012-12-02 DIAGNOSIS — M1711 Unilateral primary osteoarthritis, right knee: Secondary | ICD-10-CM

## 2012-12-02 DIAGNOSIS — E781 Pure hyperglyceridemia: Secondary | ICD-10-CM | POA: Insufficient documentation

## 2012-12-02 DIAGNOSIS — M1710 Unilateral primary osteoarthritis, unspecified knee: Secondary | ICD-10-CM

## 2012-12-02 NOTE — Patient Instructions (Signed)
Follow up labs in June  Notify us of pain med needs then we can issue prescription and set up follow up

## 2012-12-02 NOTE — Progress Notes (Signed)
Subjective:     Patient ID: Jared Flowers, male   DOB: 1976/01/05, 37 y.o.   MRN: [id number]  HPI Patient presents after having surgery on his right knee he is wearing an immobilizer. He denies any swelling in the calf he denies shortness of breath nausea vomiting or diarrhea  Review of Systemssee above Patient maintaining healthy diet. Past medical history family history social all reviewed    Objective:   Physical Examlungs are clear heart regular pulse normal blood pressure noted no swelling in the calf immobilizer in place     Assessment:     Osteoarthritis of right knee  Hypertriglyceridemia - Plan: Hepatic function panel, Lipid panel       Plan:      see above

## 2012-12-08 ENCOUNTER — Telehealth: Payer: Self-pay | Admitting: Family Medicine

## 2012-12-08 NOTE — Telephone Encounter (Signed)
Nurse: 3:01PM Patient: Jared Flowers  Pharm: [facility] CB: [phone]  Message: Tramadol - breaking the patient out (*) Patient is needing to know if he could go back on the Oxycodone. ** Patient is calling back to follow up on Dr. Lorin Picket request of letting him know when he was completed with his other meds from surgery.(*) No other pains available at this time - return Tramadol to Dr.Whitfield @ [facility].

## 2012-12-08 NOTE — Telephone Encounter (Signed)
Per [facility]:   Patient got                                                             11/17/12 Percocet #120 must last 30 days from Dr. Gerda Diss- Not due till 12/17/12                                                               11/25/12  Dilaudid 2mg  #40 -Hold Oxycodone while taking this med- from ortho                                                              12/02/12 tramadol 50 mg #30 from ortho

## 2012-12-08 NOTE — Telephone Encounter (Signed)
Discussed with patient. Patient verbalized understanding. 

## 2012-12-08 NOTE — Telephone Encounter (Signed)
No, he should still have percocet.

## 2012-12-17 ENCOUNTER — Ambulatory Visit (INDEPENDENT_AMBULATORY_CARE_PROVIDER_SITE_OTHER): Payer: Medicaid Other | Admitting: Family Medicine

## 2012-12-17 ENCOUNTER — Telehealth: Payer: Self-pay | Admitting: Family Medicine

## 2012-12-17 ENCOUNTER — Encounter: Payer: Self-pay | Admitting: Family Medicine

## 2012-12-17 VITALS — BP 123/84 | HR 70 | Ht 71.0 in | Wt 226.0 lb

## 2012-12-17 DIAGNOSIS — E781 Pure hyperglyceridemia: Secondary | ICD-10-CM

## 2012-12-17 MED ORDER — LORATADINE 10 MG PO TABS: 10.0000 mg | ORAL_TABLET | Freq: Every day | ORAL | Status: DC

## 2012-12-17 MED ORDER — OXYCODONE-ACETAMINOPHEN 5-325 MG PO TABS: 1.0000 | ORAL_TABLET | ORAL | Status: DC | PRN

## 2012-12-17 MED ORDER — GEMFIBROZIL 600 MG PO TABS: 600.0000 mg | ORAL_TABLET | Freq: Two times a day (BID) | ORAL | Status: DC

## 2012-12-17 MED ORDER — IBUPROFEN 800 MG PO TABS: 800.0000 mg | ORAL_TABLET | Freq: Three times a day (TID) | ORAL | Status: DC

## 2012-12-17 NOTE — Telephone Encounter (Signed)
Pt was seen this morning. Spoke with you about getting a nicotine patch. It is covered by [facility] and he wants a prescription for it called into [facility]

## 2012-12-17 NOTE — Telephone Encounter (Signed)
Will do.I have not closed his visit from today I will enter it later this morning. I will send it.

## 2012-12-17 NOTE — Patient Instructions (Signed)
Follow up check of shoulder in 4 to 6 weeks
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
## 2012-12-18 ENCOUNTER — Other Ambulatory Visit: Payer: Self-pay | Admitting: Family Medicine

## 2012-12-18 MED ORDER — NICOTINE 21 MG/24HR TD PT24: 1.0000 | MEDICATED_PATCH | TRANSDERMAL | Status: DC

## 2012-12-21 LAB — LIPID PANEL

## 2013-01-08 ENCOUNTER — Telehealth (HOSPITAL_COMMUNITY): Payer: Self-pay

## 2013-01-08 ENCOUNTER — Ambulatory Visit (HOSPITAL_COMMUNITY): Payer: Medicaid Other | Admitting: Physical Therapy

## 2013-01-13 ENCOUNTER — Telehealth: Payer: Self-pay | Admitting: Family Medicine

## 2013-01-13 NOTE — Telephone Encounter (Signed)
Pt states his refill for his percocet 5/325 is renewable on 5/11 which is a Sunday. He wants to know if you can go ahead an have his script ready for this Friday instead?

## 2013-01-13 NOTE — Telephone Encounter (Signed)
He may have prescription for oxycodone 5 mg/325 mg. #120. One 4 times a day when necessary. He will need to followup within the next 3 weeks.

## 2013-01-14 MED ORDER — OXYCODONE-ACETAMINOPHEN 5-325 MG PO TABS: 1.0000 | ORAL_TABLET | ORAL | Status: DC | PRN

## 2013-01-14 NOTE — Telephone Encounter (Signed)
Rx printed and left up front for patient pick up. Patient aware. 

## 2013-01-17 ENCOUNTER — Encounter (HOSPITAL_COMMUNITY): Payer: Self-pay | Admitting: Emergency Medicine

## 2013-01-17 DIAGNOSIS — E785 Hyperlipidemia, unspecified: Secondary | ICD-10-CM | POA: Insufficient documentation

## 2013-01-17 DIAGNOSIS — Z8719 Personal history of other diseases of the digestive system: Secondary | ICD-10-CM | POA: Insufficient documentation

## 2013-01-17 DIAGNOSIS — X12XXXA Contact with other hot fluids, initial encounter: Secondary | ICD-10-CM | POA: Insufficient documentation

## 2013-01-17 DIAGNOSIS — T25239A Burn of second degree of unspecified toe(s) (nail), initial encounter: Secondary | ICD-10-CM | POA: Insufficient documentation

## 2013-01-17 DIAGNOSIS — Y9389 Activity, other specified: Secondary | ICD-10-CM | POA: Insufficient documentation

## 2013-01-17 DIAGNOSIS — Z8739 Personal history of other diseases of the musculoskeletal system and connective tissue: Secondary | ICD-10-CM | POA: Insufficient documentation

## 2013-01-17 DIAGNOSIS — Y929 Unspecified place or not applicable: Secondary | ICD-10-CM | POA: Insufficient documentation

## 2013-01-17 DIAGNOSIS — Z8614 Personal history of Methicillin resistant Staphylococcus aureus infection: Secondary | ICD-10-CM | POA: Insufficient documentation

## 2013-01-17 DIAGNOSIS — F17200 Nicotine dependence, unspecified, uncomplicated: Secondary | ICD-10-CM | POA: Insufficient documentation

## 2013-01-17 DIAGNOSIS — J45909 Unspecified asthma, uncomplicated: Secondary | ICD-10-CM | POA: Insufficient documentation

## 2013-01-17 DIAGNOSIS — X131XXA Other contact with steam and other hot vapors, initial encounter: Secondary | ICD-10-CM | POA: Insufficient documentation
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
## 2013-01-18 ENCOUNTER — Emergency Department (HOSPITAL_COMMUNITY)
Admission: EM | Admit: 2013-01-18 | Discharge: 2013-01-18 | Disposition: A | Discharge status: Home / Self Care | Payer: Medicaid Other | Attending: Emergency Medicine | Admitting: Emergency Medicine

## 2013-01-18 DIAGNOSIS — T25232A Burn of second degree of left toe(s) (nail), initial encounter: Secondary | ICD-10-CM

## 2013-01-18 MED ORDER — NAPROXEN 500 MG PO TABS: 500.0000 mg | ORAL_TABLET | Freq: Two times a day (BID) | ORAL | Status: DC
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
## 2013-01-18 NOTE — ED Notes (Signed)
Pt was dc by S. Moore RN/ ambulated out without difficulty.

## 2013-01-19 ENCOUNTER — Ambulatory Visit (HOSPITAL_COMMUNITY)
Admission: RE | Admit: 2013-01-19 | Discharge: 2013-01-19 | Disposition: A | Discharge status: Home / Self Care | Payer: Medicaid Other | Source: Ambulatory Visit | Attending: Orthopaedic Surgery | Admitting: Orthopaedic Surgery

## 2013-01-19 DIAGNOSIS — M25669 Stiffness of unspecified knee, not elsewhere classified: Secondary | ICD-10-CM | POA: Insufficient documentation

## 2013-01-19 DIAGNOSIS — R262 Difficulty in walking, not elsewhere classified: Secondary | ICD-10-CM | POA: Insufficient documentation

## 2013-01-19 DIAGNOSIS — M6281 Muscle weakness (generalized): Secondary | ICD-10-CM | POA: Insufficient documentation

## 2013-01-19 DIAGNOSIS — IMO0001 Reserved for inherently not codable concepts without codable children: Secondary | ICD-10-CM | POA: Insufficient documentation

## 2013-01-19 DIAGNOSIS — M25569 Pain in unspecified knee: Secondary | ICD-10-CM | POA: Insufficient documentation
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
## 2013-01-21 ENCOUNTER — Ambulatory Visit: Payer: Medicaid Other | Admitting: Family Medicine

## 2013-01-25 ENCOUNTER — Encounter: Payer: Self-pay | Admitting: Family Medicine

## 2013-02-03 ENCOUNTER — Other Ambulatory Visit (HOSPITAL_COMMUNITY): Payer: Self-pay | Admitting: Orthopaedic Surgery

## 2013-02-03 DIAGNOSIS — M25511 Pain in right shoulder: Secondary | ICD-10-CM

## 2013-02-04 ENCOUNTER — Ambulatory Visit: Payer: Medicaid Other | Admitting: Family Medicine

## 2013-02-05 ENCOUNTER — Ambulatory Visit (HOSPITAL_COMMUNITY)
Admission: RE | Admit: 2013-02-05 | Discharge: 2013-02-05 | Disposition: A | Discharge status: Home / Self Care | Payer: Medicaid Other | Source: Ambulatory Visit | Attending: Orthopaedic Surgery | Admitting: Orthopaedic Surgery

## 2013-02-05 ENCOUNTER — Other Ambulatory Visit (HOSPITAL_COMMUNITY): Payer: Self-pay | Admitting: Orthopaedic Surgery

## 2013-02-05 DIAGNOSIS — M25519 Pain in unspecified shoulder: Secondary | ICD-10-CM | POA: Insufficient documentation

## 2013-02-05 DIAGNOSIS — M25511 Pain in right shoulder: Secondary | ICD-10-CM

## 2013-02-05 DIAGNOSIS — IMO0002 Reserved for concepts with insufficient information to code with codable children: Secondary | ICD-10-CM | POA: Insufficient documentation

## 2013-02-05 DIAGNOSIS — W19XXXA Unspecified fall, initial encounter: Secondary | ICD-10-CM | POA: Insufficient documentation

## 2013-02-05 MED ORDER — IOHEXOL 350 MG/ML SOLN
50.0000 mL | Freq: Once | INTRAVENOUS | Status: AC | PRN
  Administered 2013-02-05: 10 mL via INTRAVENOUS

## 2013-02-05 MED ORDER — GADOBENATE DIMEGLUMINE 529 MG/ML IV SOLN
5.0000 mL | Freq: Once | INTRAVENOUS | Status: AC | PRN
  Administered 2013-02-05: 0.5 mL via INTRAVENOUS

## 2013-02-05 MED ORDER — LIDOCAINE HCL (PF) 1 % IJ SOLN
INTRAMUSCULAR | Status: AC
  Filled 2013-02-05: qty 5

## 2013-02-05 NOTE — Procedures (Signed)
PreOperative Dx: Right shoulder pain Postoperative Dx: Right shoulder pain Procedure:   RIGHT shoulder joint injection for MR under fluoroscopy Radiologist:  Tyron Russell Anesthesia:  1.5 ml of 1% lidocaine Specimen:  None EBL:   None Complications: None Injected with 10 ml of a solution of [0.1 ml Multihance in 20 ml Omnipaque-350]

## 2013-02-09 ENCOUNTER — Ambulatory Visit: Payer: Medicaid Other | Admitting: Family Medicine

## 2013-02-15 ENCOUNTER — Telehealth: Payer: Self-pay | Admitting: Family Medicine

## 2013-02-15 NOTE — Telephone Encounter (Signed)
Patient would like a prescription for Oxycodone.  Thanks

## 2013-02-15 NOTE — Telephone Encounter (Signed)
Pt states he doesn't have a pain management doctor. He wants to get referral to Dr Gerilyn Pilgrim

## 2013-02-15 NOTE — Telephone Encounter (Signed)
No, I can not refill his oxycodone. He had 120 from [place] on 01/13/13 and 40 by ortho on 5/28 and 40 on 6/4. We will not be prescribing his pain medicine further. He can get this through a pain management doctor.

## 2013-02-15 NOTE — Telephone Encounter (Signed)
  Last seen 12/17/2012

## 2013-02-16 ENCOUNTER — Other Ambulatory Visit: Payer: Self-pay | Admitting: Family Medicine

## 2013-02-16 DIAGNOSIS — G894 Chronic pain syndrome: Secondary | ICD-10-CM

## 2013-02-16 NOTE — Telephone Encounter (Signed)
This patient was getting pain medications through [place]. He was also requesting pain medicine from the orthopedics. His level of pain is beyond the level that we can care for her. I will not be prescribing further oxycodone for this patient. Referral was put in. Patient is aware that we were working on referral. May file.

## 2013-02-17 ENCOUNTER — Encounter: Payer: Self-pay | Admitting: Family Medicine

## 2013-02-17 ENCOUNTER — Ambulatory Visit (INDEPENDENT_AMBULATORY_CARE_PROVIDER_SITE_OTHER): Payer: Medicaid Other | Admitting: Family Medicine

## 2013-02-17 VITALS — BP 114/78 | Wt 222.0 lb

## 2013-02-17 DIAGNOSIS — M25511 Pain in right shoulder: Secondary | ICD-10-CM

## 2013-02-17 DIAGNOSIS — M25519 Pain in unspecified shoulder: Secondary | ICD-10-CM

## 2013-02-17 MED ORDER — OXYCODONE-ACETAMINOPHEN 5-325 MG PO TABS: 1.0000 | ORAL_TABLET | Freq: Four times a day (QID) | ORAL | Status: DC | PRN

## 2013-02-17 NOTE — Progress Notes (Signed)
  Subjective:    Patient ID: Jared Flowers, male    DOB: 01/13/1976, 37 y.o.   MRN: [id number]  HPI This patient has been having severe shoulder pain. He has abnormal MRI. Is supposed to be having surgery in July.  He had put the surgery off because his work does not allow him any additional time off and he would lose his job and potentially lose income to take care of his kids and his household. Over the past few months we have had to prescribe oxycodone on a regular basis to help him with his pain and discomfort. Anti-inflammatories did not do enough. Hydrocodone causes nausea and vomiting with him. He denies abusing the medicine he states he's just trying to get relief. At one time he is having problems with his knee and his shoulder but the knee is doing much better now. He had been under the care of Dr. Cleophas Dunker. He plans on going back to them in July for surgery. Family history noncontributory social history noncontributory patient denies abusing medicines he states he just uses it to help him function   Review of Systems No shortness breath chest pain numbness.    Objective:   Physical Exam  Lungs clear, heart is regular, shoulder pain and discomfort      Assessment & Plan:  Chronic shoulder pain-oxycodone #65 mg/325. 1 every 6 hours as needed for severe pain cautioned drowsiness. I told the patient that I am not comfortable with doing long-term pain management. This is outside my area of expertise. I do believe this patient is having legitimate pain and discomfort I believe he is motivated to having surgery in July and is motivated to coming off the medication. I do believe it's within his best interest to go ahead and be referred to chronic pain management more than likely will be under their care for several months until he recovers from his shoulder.

## 2013-02-23 ENCOUNTER — Ambulatory Visit (HOSPITAL_COMMUNITY): Admission: RE | Admit: 2013-02-23 | Payer: Medicaid Other | Source: Ambulatory Visit

## 2013-02-25 ENCOUNTER — Ambulatory Visit (HOSPITAL_COMMUNITY): Admission: RE | Admit: 2013-02-25 | Payer: Medicaid Other | Source: Ambulatory Visit | Admitting: Specialist

## 2013-03-04 ENCOUNTER — Ambulatory Visit (HOSPITAL_COMMUNITY): Admission: RE | Admit: 2013-03-04 | Payer: Medicaid Other | Source: Ambulatory Visit

## 2013-03-18 ENCOUNTER — Encounter (HOSPITAL_COMMUNITY): Payer: Self-pay | Admitting: Emergency Medicine

## 2013-03-18 ENCOUNTER — Emergency Department (HOSPITAL_COMMUNITY)
Admission: EM | Admit: 2013-03-18 | Discharge: 2013-03-18 | Disposition: A | Discharge status: Home / Self Care | Payer: Medicaid Other | Attending: Emergency Medicine | Admitting: Emergency Medicine

## 2013-03-18 DIAGNOSIS — S43429A Sprain of unspecified rotator cuff capsule, initial encounter: Secondary | ICD-10-CM | POA: Insufficient documentation

## 2013-03-18 DIAGNOSIS — F17200 Nicotine dependence, unspecified, uncomplicated: Secondary | ICD-10-CM | POA: Insufficient documentation

## 2013-03-18 DIAGNOSIS — R51 Headache: Secondary | ICD-10-CM | POA: Insufficient documentation

## 2013-03-18 DIAGNOSIS — M549 Dorsalgia, unspecified: Secondary | ICD-10-CM | POA: Insufficient documentation

## 2013-03-18 DIAGNOSIS — Z79899 Other long term (current) drug therapy: Secondary | ICD-10-CM | POA: Insufficient documentation

## 2013-03-18 DIAGNOSIS — J45901 Unspecified asthma with (acute) exacerbation: Secondary | ICD-10-CM | POA: Insufficient documentation

## 2013-03-18 DIAGNOSIS — K219 Gastro-esophageal reflux disease without esophagitis: Secondary | ICD-10-CM | POA: Insufficient documentation

## 2013-03-18 DIAGNOSIS — J3489 Other specified disorders of nose and nasal sinuses: Secondary | ICD-10-CM | POA: Insufficient documentation

## 2013-03-18 DIAGNOSIS — E785 Hyperlipidemia, unspecified: Secondary | ICD-10-CM | POA: Insufficient documentation

## 2013-03-18 DIAGNOSIS — Z8614 Personal history of Methicillin resistant Staphylococcus aureus infection: Secondary | ICD-10-CM | POA: Insufficient documentation

## 2013-03-18 DIAGNOSIS — H538 Other visual disturbances: Secondary | ICD-10-CM | POA: Insufficient documentation

## 2013-03-18 DIAGNOSIS — G8929 Other chronic pain: Secondary | ICD-10-CM

## 2013-03-18 DIAGNOSIS — Y929 Unspecified place or not applicable: Secondary | ICD-10-CM | POA: Insufficient documentation

## 2013-03-18 DIAGNOSIS — Z792 Long term (current) use of antibiotics: Secondary | ICD-10-CM | POA: Insufficient documentation

## 2013-03-18 DIAGNOSIS — X503XXA Overexertion from repetitive movements, initial encounter: Secondary | ICD-10-CM | POA: Insufficient documentation

## 2013-03-18 DIAGNOSIS — Y9389 Activity, other specified: Secondary | ICD-10-CM | POA: Insufficient documentation

## 2013-03-18 DIAGNOSIS — R109 Unspecified abdominal pain: Secondary | ICD-10-CM | POA: Insufficient documentation

## 2013-03-18 DIAGNOSIS — Y990 Civilian activity done for income or pay: Secondary | ICD-10-CM | POA: Insufficient documentation

## 2013-03-18 MED ORDER — OXYCODONE-ACETAMINOPHEN 5-325 MG PO TABS: 1.0000 | ORAL_TABLET | Freq: Four times a day (QID) | ORAL | Status: DC | PRN

## 2013-03-18 NOTE — ED Provider Notes (Signed)
History  This chart was scribed for Shelda Jakes, MD by Bennett Scrape, ED Scribe. This patient was seen in room APA10/[facility]10 and the patient's care was started at 9:12 AM.  CSN: [id number]  Arrival date & time 03/18/13  6578   First MD Initiated Contact with Patient 03/18/13 0912     Chief Complaint  Patient presents with  . Shoulder Pain    Patient is a 37 y.o. male presenting with shoulder pain. The history is provided by the patient. No language interpreter was used.  Shoulder Pain This is a chronic problem. The current episode started yesterday. The problem occurs constantly. The problem has been gradually worsening. Associated symptoms include abdominal pain (chronic, no changes), headaches (chronic) and shortness of breath (chronic from asthma). Pertinent negatives include no chest pain. Exacerbated by: movement. The symptoms are relieved by rest. He has tried rest for the symptoms. The treatment provided mild relief.    HPI Comments: Jared Flowers is a 37 y.o. male who presents to the Emergency Department complaining of worsening of chronic right shoulder pain since yesterday. He rates his pain a 10 out of 10 currently and states that the pain is worse with movement of the right arm. Pt reports that he has a known rotator cuff injury and is following up with Dr. Cleophas Dunker for the symptoms. 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 He reports that he has been using a sling at home but denies being on any pain medications currently. He reports several chronic pains but denies changes. He has a h/o HLD and is a current everyday smoker but denies alcohol use.   Past Medical History  Diagnosis Date  . Acid reflux   . Joint pain   . Back pain   . Erosive esophagitis   . Asthma   . Hyperlipidemia   . MRSA (methicillin resistant Staphylococcus aureus)     admitted to hospital 2010   Past Surgical History  Procedure  Laterality Date  . Knee surgery      right knee   Family History  Problem Relation Age of Onset  . Cancer    . Asthma    . Diabetes    . Kidney disease    . Hypertension Mother   . Cancer Father   . Hypertension Father   . Cancer Sister     breast  . Cancer Brother     lung   History  Substance Use Topics  . Smoking status: Current Every Day Smoker -- 0.50 packs/day    Types: Cigarettes  . Smokeless tobacco: Not on file  . Alcohol Use: No     Comment: occasional     Review of Systems  Constitutional: Negative for fever and chills.  HENT: Positive for congestion. Negative for sore throat and neck pain.   Eyes: Positive for visual disturbance (blurred).  Respiratory: Positive for shortness of breath (chronic from asthma). Negative for cough.   Cardiovascular: Negative for chest pain and leg swelling.  Gastrointestinal: Positive for abdominal pain (chronic, no changes). Negative for nausea, vomiting and diarrhea.  Genitourinary: Negative for dysuria and hematuria.  Musculoskeletal: Positive for back pain (chronic) and arthralgias.  Skin: Negative for rash.  Neurological: Positive for headaches (chronic).  Hematological: Does not bruise/bleed easily.  Psychiatric/Behavioral: Negative for confusion.    Allergies  Hydrocodone-acetaminophen  Home Medications   Current Outpatient Rx  Name  Route  Sig  Dispense  Refill  .  gemfibrozil (LOPID) 600 MG tablet   Oral   Take 1 tablet (600 mg total) by mouth 2 (two) times daily before a meal.   60 tablet   6   . ibuprofen (ADVIL,MOTRIN) 800 MG tablet   Oral   Take 1 tablet (800 mg total) by mouth 3 (three) times daily.   90 tablet   6     Please take after eating.   Marland Kitchen loratadine (CLARITIN) 10 MG tablet   Oral   Take 1 tablet (10 mg total) by mouth daily.   30 tablet   6   . naproxen (NAPROSYN) 500 MG tablet   Oral   Take 1 tablet (500 mg total) by mouth 2 (two) times daily.   14 tablet   0   . nicotine  (NICODERM CQ - DOSED IN MG/24 HOURS) 21 mg/24hr patch   Transdermal   Place 1 patch onto the skin daily.   28 patch   0     He should use this for 4 weeks, if doing well then ...   . oxyCODONE-acetaminophen (PERCOCET/ROXICET) 5-325 MG per tablet   Oral   Take 1 tablet by mouth every 6 (six) hours as needed. pain   60 tablet   0     Try to keep at 4 times caution drowsiness   . TRAMADOL HCL PO   Oral   Take by mouth.          Triage Vitals: BP 143/89  Pulse 74  Temp(Src) 98.2 F (36.8 C)  Resp 18  Ht 5\' 10"  (1.778 m)  Wt 225 lb (102.059 kg)  BMI 32.28 kg/m2  SpO2 98%  Physical Exam  Nursing note and vitals reviewed. Constitutional: He is oriented to person, place, and time. He appears well-developed and well-nourished. No distress.  HENT:  Head: Normocephalic and atraumatic.  Mouth/Throat: Oropharynx is clear and moist.  Eyes: Conjunctivae and EOM are normal. Pupils are equal, round, and reactive to light.  Sclera are clear  Neck: Neck supple. No tracheal deviation present.  Cardiovascular: Normal rate and regular rhythm.   No murmur heard. Pulses:      Dorsalis pedis pulses are 2+ on the right side, and 2+ on the left side.  Pulmonary/Chest: Effort normal and breath sounds normal. No respiratory distress. He has no wheezes.  Abdominal: Soft. Bowel sounds are normal. He exhibits no distension. There is no tenderness.  Musculoskeletal: Normal range of motion. He exhibits no edema (no ankle swelling).  No deformity palpated to the right shoulder   Lymphadenopathy:    He has no cervical adenopathy.  Neurological: He is alert and oriented to person, place, and time. No cranial nerve deficit.  Pt able to move both sets of fingers and toes  Skin: Skin is warm and dry. No rash noted.  Psychiatric: He has a normal mood and affect. His behavior is normal.    ED Course  Procedures (including critical care time)  DIAGNOSTIC STUDIES: Oxygen Saturation is 98% on room  air, normal by my interpretation.    COORDINATION OF CARE: 9:39 AM-Discussed discharge plan which includes pain medication and work note with pt at bedside and pt agreed to plan. Advised pt to use sling at home and to follow up with orthopedist.   Labs Reviewed - No data to display No results found.  1. Chronic shoulder pain, right     MDM  Patient with known rotator cuff injury to the right shoulder. Awaiting surgery. Patient with  re\re injury of the arm at work. Will need to be placed back in a sling pain medication to use the arm for 7 days. No evidence of deformity or dislocation on exam. Patient's had multiple x-rays of the shoulder to include CT scans and MRIs.  I personally performed the services described in this documentation, which was scribed in my presence. The recorded information has been reviewed and is accurate.     Shelda Jakes, MD 03/18/13 [phone]

## 2013-03-18 NOTE — ED Notes (Signed)
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

## 2013-03-22 ENCOUNTER — Emergency Department (HOSPITAL_COMMUNITY): Payer: Medicaid Other

## 2013-03-22 ENCOUNTER — Encounter (HOSPITAL_COMMUNITY): Payer: Self-pay | Admitting: *Deleted

## 2013-03-22 ENCOUNTER — Emergency Department (HOSPITAL_COMMUNITY)
Admission: EM | Admit: 2013-03-22 | Discharge: 2013-03-22 | Disposition: A | Discharge status: Home / Self Care | Payer: Medicaid Other | Attending: Emergency Medicine | Admitting: Emergency Medicine

## 2013-03-22 DIAGNOSIS — Z79899 Other long term (current) drug therapy: Secondary | ICD-10-CM | POA: Insufficient documentation

## 2013-03-22 DIAGNOSIS — F17200 Nicotine dependence, unspecified, uncomplicated: Secondary | ICD-10-CM | POA: Insufficient documentation

## 2013-03-22 DIAGNOSIS — Z8614 Personal history of Methicillin resistant Staphylococcus aureus infection: Secondary | ICD-10-CM | POA: Insufficient documentation

## 2013-03-22 DIAGNOSIS — Z9889 Other specified postprocedural states: Secondary | ICD-10-CM | POA: Insufficient documentation

## 2013-03-22 DIAGNOSIS — J45909 Unspecified asthma, uncomplicated: Secondary | ICD-10-CM | POA: Insufficient documentation

## 2013-03-22 DIAGNOSIS — E785 Hyperlipidemia, unspecified: Secondary | ICD-10-CM | POA: Insufficient documentation

## 2013-03-22 DIAGNOSIS — Z8719 Personal history of other diseases of the digestive system: Secondary | ICD-10-CM | POA: Insufficient documentation

## 2013-03-22 DIAGNOSIS — T148XXA Other injury of unspecified body region, initial encounter: Secondary | ICD-10-CM

## 2013-03-22 DIAGNOSIS — R233 Spontaneous ecchymoses: Secondary | ICD-10-CM | POA: Insufficient documentation

## 2013-03-22 LAB — CBC
MCH: 28.7 pg (ref 26.0–34.0)
Platelets: 206 10*3/uL (ref 150–400)
RBC: 4.81 MIL/uL (ref 4.22–5.81)
WBC: 5.6 10*3/uL (ref 4.0–10.5)

## 2013-03-22 MED ORDER — OXYCODONE-ACETAMINOPHEN 5-325 MG PO TABS: 1.0000 | ORAL_TABLET | ORAL | Status: DC | PRN

## 2013-03-22 NOTE — ED Notes (Signed)
Lt knee pain with ?contusion to medial aspect. For 3 days

## 2013-03-27 NOTE — ED Provider Notes (Signed)
History    CSN: [id number] Arrival date & time 03/22/13  1747  First MD Initiated Contact with Patient 03/22/13 1812     Chief Complaint  Patient presents with  . Knee Pain   (Consider location/radiation/quality/duration/timing/severity/associated sxs/prior Treatment) HPI Comments: Jared Flowers is a 37 y.o. Male presenting with  left knee pain without injury which started 3 days ago.  The area of worst pain,  The left medial knee started as a small raised red knot which he states looked like an insect bite and he was seen by his pcp for this yesterday who was unsure of the diagnosis but was told he would be referred to a dermatologist if his symptoms worsened.  He has been using prescription strength ibuprofen without relief.  The insect bite like lesion has developed into a bruise like lesion today.  He denies any known insect bites, no trauma and no other skin lesions, no unexplained other areas of bruising.  He has had no fevers, chills or radiation of pain.      Patient is a 37 y.o. male presenting with knee pain. The history is provided by the patient.  Knee Pain Associated symptoms: no fever    Past Medical History  Diagnosis Date  . Acid reflux   . Joint pain   . Back pain   . Erosive esophagitis   . Asthma   . Hyperlipidemia   . MRSA (methicillin resistant Staphylococcus aureus)     admitted to hospital 2010   Past Surgical History  Procedure Laterality Date  . Knee surgery      right knee   Family History  Problem Relation Age of Onset  . Cancer    . Asthma    . Diabetes    . Kidney disease    . Hypertension Mother   . Cancer Father   . Hypertension Father   . Cancer Sister     breast  . Cancer Brother     lung   History  Substance Use Topics  . Smoking status: Current Every Day Smoker -- 0.50 packs/day    Types: Cigarettes  . Smokeless tobacco: Not on file  . Alcohol Use: No     Comment: occasional     Review of Systems  Constitutional:  Negative for fever and chills.  Musculoskeletal: Positive for arthralgias. Negative for myalgias and joint swelling.  Skin: Positive for color change. Negative for rash and wound.  Neurological: Negative for weakness and numbness.    Allergies  Hydrocodone-acetaminophen  Home Medications   Current Outpatient Rx  Name  Route  Sig  Dispense  Refill  . fish oil-omega-3 fatty acids 1000 MG capsule   Oral   Take 1 g by mouth daily.         Marland Kitchen gemfibrozil (LOPID) 600 MG tablet   Oral   Take 1 tablet (600 mg total) by mouth 2 (two) times daily before a meal.   60 tablet   6   . ibuprofen (ADVIL,MOTRIN) 800 MG tablet   Oral   Take 1 tablet (800 mg total) by mouth 3 (three) times daily.   90 tablet   6     Please take after eating.   Marland Kitchen oxyCODONE-acetaminophen (PERCOCET/ROXICET) 5-325 MG per tablet   Oral   Take 1-2 tablets by mouth every 6 (six) hours as needed for pain.   20 tablet   0   . oxyCODONE-acetaminophen (PERCOCET/ROXICET) 5-325 MG per tablet   Oral  Take 1 tablet by mouth every 4 (four) hours as needed for pain.   20 tablet   0    BP 136/92  Pulse 82  Temp(Src) 98.4 F (36.9 C) (Oral)  Resp 18  Ht 5\' 10"  (1.778 m)  Wt 221 lb (100.245 kg)  BMI 31.71 kg/m2  SpO2 100% Physical Exam  Constitutional: He appears well-developed and well-nourished.  HENT:  Head: Atraumatic.  Neck: Normal range of motion.  Cardiovascular:  Pulses equal bilaterally  Musculoskeletal: He exhibits tenderness. He exhibits no edema.       Left knee: He exhibits ecchymosis. He exhibits no swelling, no deformity, no erythema, normal alignment, no LCL laxity, normal meniscus and no MCL laxity.       Legs: Neurological: He is alert. He has normal strength. He displays normal reflexes. No sensory deficit.  Equal strength  Skin: Skin is warm and dry.  Psychiatric: He has a normal mood and affect.    ED Course  Procedures (including critical care time) Labs Reviewed  CBC -  Abnormal; Notable for the following:    HCT 38.6 (*)    All other components within normal limits   No results found. 1. Contusion     MDM  Patients labs and/or radiological studies were viewed and considered during the medical decision making and disposition process. Pt with knee pain and medial contusion without history of injury.  No knee edema, he can range the knee,  No exam finding suggesting septic knee joint, no suggestive of ligament tear.  Knee pain with ecchymosis of unclear etiology.  Pt was prescribed oxycodone, encouraged heat therapy.  F/u with pcp prn.  Burgess Amor, PA-C 03/27/13 [phone]

## 2013-03-27 NOTE — ED Provider Notes (Signed)
Medical screening examination/treatment/procedure(s) were performed by non-physician practitioner and as supervising physician I was immediately available for consultation/collaboration.   Emilee Market L Montrail Mehrer, MD 03/27/13 0935 

## 2013-04-06 ENCOUNTER — Ambulatory Visit (INDEPENDENT_AMBULATORY_CARE_PROVIDER_SITE_OTHER): Payer: Medicaid Other | Admitting: Family Medicine

## 2013-04-06 ENCOUNTER — Encounter (HOSPITAL_COMMUNITY): Payer: Self-pay | Admitting: *Deleted

## 2013-04-06 ENCOUNTER — Emergency Department (HOSPITAL_COMMUNITY): Payer: Medicaid Other

## 2013-04-06 ENCOUNTER — Encounter: Payer: Self-pay | Admitting: Family Medicine

## 2013-04-06 ENCOUNTER — Emergency Department (HOSPITAL_COMMUNITY)
Admission: EM | Admit: 2013-04-06 | Discharge: 2013-04-06 | Disposition: A | Discharge status: Home / Self Care | Payer: Medicaid Other | Attending: Emergency Medicine | Admitting: Emergency Medicine

## 2013-04-06 VITALS — BP 124/88 | HR 80 | Wt 221.4 lb

## 2013-04-06 DIAGNOSIS — Z9889 Other specified postprocedural states: Secondary | ICD-10-CM | POA: Insufficient documentation

## 2013-04-06 DIAGNOSIS — Z8639 Personal history of other endocrine, nutritional and metabolic disease: Secondary | ICD-10-CM | POA: Insufficient documentation

## 2013-04-06 DIAGNOSIS — F17200 Nicotine dependence, unspecified, uncomplicated: Secondary | ICD-10-CM | POA: Insufficient documentation

## 2013-04-06 DIAGNOSIS — Z862 Personal history of diseases of the blood and blood-forming organs and certain disorders involving the immune mechanism: Secondary | ICD-10-CM | POA: Insufficient documentation

## 2013-04-06 DIAGNOSIS — S39012A Strain of muscle, fascia and tendon of lower back, initial encounter: Secondary | ICD-10-CM

## 2013-04-06 DIAGNOSIS — M25561 Pain in right knee: Secondary | ICD-10-CM

## 2013-04-06 DIAGNOSIS — M25569 Pain in unspecified knee: Secondary | ICD-10-CM | POA: Insufficient documentation

## 2013-04-06 DIAGNOSIS — S335XXA Sprain of ligaments of lumbar spine, initial encounter: Secondary | ICD-10-CM

## 2013-04-06 DIAGNOSIS — Z79899 Other long term (current) drug therapy: Secondary | ICD-10-CM | POA: Insufficient documentation

## 2013-04-06 DIAGNOSIS — M25469 Effusion, unspecified knee: Secondary | ICD-10-CM | POA: Insufficient documentation

## 2013-04-06 DIAGNOSIS — Z8614 Personal history of Methicillin resistant Staphylococcus aureus infection: Secondary | ICD-10-CM | POA: Insufficient documentation

## 2013-04-06 DIAGNOSIS — J45909 Unspecified asthma, uncomplicated: Secondary | ICD-10-CM | POA: Insufficient documentation

## 2013-04-06 DIAGNOSIS — Z8719 Personal history of other diseases of the digestive system: Secondary | ICD-10-CM | POA: Insufficient documentation

## 2013-04-06 DIAGNOSIS — Z791 Long term (current) use of non-steroidal anti-inflammatories (NSAID): Secondary | ICD-10-CM | POA: Insufficient documentation

## 2013-04-06 MED ORDER — OXYCODONE-ACETAMINOPHEN 5-325 MG PO TABS: 1.0000 | ORAL_TABLET | ORAL | Status: DC | PRN

## 2013-04-06 MED ORDER — CHLORZOXAZONE 500 MG PO TABS: 500.0000 mg | ORAL_TABLET | Freq: Four times a day (QID) | ORAL | Status: DC | PRN

## 2013-04-06 MED ORDER — OXYCODONE-ACETAMINOPHEN 5-325 MG PO TABS
1.0000 | ORAL_TABLET | Freq: Once | ORAL | Status: AC
  Administered 2013-04-06: 1 via ORAL
  Filled 2013-04-06: qty 1

## 2013-04-06 NOTE — ED Notes (Signed)
C/o right knee pain onset this morning - denies injury.  Reports hx of problems with this knee.

## 2013-04-06 NOTE — Progress Notes (Signed)
  Subjective:    Patient ID: Jared Flowers, male    DOB: 30-May-1976, 37 y.o.   MRN: [id number]  Back Pain This is a new problem. The current episode started yesterday. The problem occurs constantly. The problem has been gradually worsening since onset. The quality of the pain is described as aching. The pain is worse during the night.      Review of Systems  Musculoskeletal: Positive for back pain.   he states he aggravated the back when he was doing some painting. Describes pain discomfort in his low back radiates into the left does not go down the legs he would like to have something to help with discomfort he's been trying ibuprofen without success  He also relates that he is going to be seeing the pain medicine Dr. later this year     Objective:   Physical Exam Lungs are clear hearts regular subjective discomfort in the low back negative straight leg       Assessment & Plan:  Lumbar pain-muscle relaxer as prescribed oxycodone #20 as prescribed cautioned drowsiness. We will not be prescribing ongoing narcotics. Patient will be following up with pain medicine Dr. in October.

## 2013-04-06 NOTE — Patient Instructions (Addendum)
Wood Tick Bite Ticks are insects that attach themselves to the skin. Most tick bites are harmless, but sometimes ticks carry diseases that can make a person quite ill. The chance of getting ill depends on:  The kind of tick that bites you.  Time of year.  How long the tick is attached.  Geographic location. Wood ticks are also called dog ticks. They are generally black. They can have white markings. They live in shrubs and grassy areas. They are larger than deer ticks. Wood ticks are about the size of a watermelon seed. They have a hard body. The most common places for ticks to attach themselves are the scalp, neck, armpits, waist, and groin. Wood ticks may stay attached for up to 2 weeks. TICKS MUST BE REMOVED AS SOON AS POSSIBLE TO HELP PREVENT DISEASES CAUSED BY TICK BITES.  TO REMOVE A TICK: 1. If available, put on latex gloves before trying to remove a tick. 2. Grasp the tick as close to the skin as possible, with curved forceps, fine tweezers or a special tick removal tool. 3. Pull gently with steady pressure until the tick lets go. Do not twist the tick or jerk it suddenly. This may break off the tick's head or mouth parts. 4. Do not crush the tick's body. This could force disease-carrying fluids from the tick into your body. 5. After the tick is removed, wash the bite area and your hands with soap and water or other disinfectant. 6. Apply a small amount of antiseptic cream or ointment to the bite site. 7. Wash and disinfect any instruments that were used. 8. Save the tick in a jar or plastic bag for later identification. Preserve the tick with a bit of alcohol or put it in the freezer. 9. Do not apply a hot match, petroleum jelly, or fingernail polish to the tick. This does not work and may increase the chances of disease from the tick bite. YOU MAY NEED TO SEE YOUR CAREGIVER FOR A TETANUS SHOT NOW IF:  You have no idea when you had the last one.  You have never had a tetanus shot  before. If you need a tetanus shot, and you decide not to get one, there is a rare chance of getting tetanus. Sickness from tetanus can be serious. If you get a tetanus shot, your arm may swell, get red and warm to the touch at the shot site. This is common and not a problem. PREVENTION  Wear protective clothing. Long sleeves and pants are best.  Wear white clothes to see ticks more easily  Tuck your pant legs into your socks.  If walking on trail, stay in the middle of the trail to avoid brushing against bushes.  Put insect repellent on all exposed skin and along boot tops, pant legs and sleeve cuffs  Check clothing, hair and skin repeatedly and before coming inside.  Brush off any ticks that are not attached. SEEK MEDICAL CARE IF:   You cannot remove a tick or part of the tick that is left in the skin.  Unexplained fever.  Redness and swelling in the area of the tick bite.  Tender, swollen lymph glands.  Diarrhea.  Weight loss.  Cough.  Fatigue.  Muscle, joint or bone pain.  Belly pain.  Headache.  Rash. SEEK IMMEDIATE MEDICAL CARE IF:   You develop an oral temperature above 102 F (38.9 C).  You are having trouble walking or moving your legs.  Numbness in the legs.    pain.   Belly pain.   Headache.   Rash.  SEEK IMMEDIATE MEDICAL CARE IF:    You develop an oral temperature above 102 F (38.9 C).   You are having trouble walking or moving your legs.   Numbness in the legs.   Shortness of breath.   Confusion.   Repeated vomiting.  Document Released: 08/23/2000 Document Revised: 11/18/2011 Document Reviewed: 08/01/2008  [employer] Patient Information 2014 [employer].

## 2013-04-07 NOTE — ED Provider Notes (Signed)
Medical screening examination/treatment/procedure(s) were performed by non-physician practitioner and as supervising physician I was immediately available for consultation/collaboration.   Laray Anger, DO 04/07/13 1650

## 2013-04-07 NOTE — ED Provider Notes (Signed)
CSN: [id number]     Arrival date & time 04/06/13  1913 History     First MD Initiated Contact with Patient 04/06/13 1926     Chief Complaint  Patient presents with  . Knee Pain   (Consider location/radiation/quality/duration/timing/severity/associated sxs/prior Treatment) HPI Comments: Jared Flowers is a 37 y.o. Male who is currently 4 months out from right arthroscopic knee surgery (Dr. Cleophas Dunker) who has developed worsening pain since he jogged this morning about a 1/2 mile longer than his normal routine.  He denies falls, twists, or other injury during the run.  His pain is constant and he has knee swelling as well.  He has used ibuprofen without pain relief.  Upon review of the chart, he was seen by his pcp today and was prescribed oxycodone, which he states he left the prescription with his doctor as he was being seen for a tick bite concern, not his knee,and did not feel he needed this strong pain reliever for that complaint.   The history is provided by the patient.    Past Medical History  Diagnosis Date  . Acid reflux   . Joint pain   . Back pain   . Erosive esophagitis   . Asthma   . Hyperlipidemia   . MRSA (methicillin resistant Staphylococcus aureus)     admitted to hospital 2010   Past Surgical History  Procedure Laterality Date  . Knee surgery      right knee   Family History  Problem Relation Age of Onset  . Cancer    . Asthma    . Diabetes    . Kidney disease    . Hypertension Mother   . Cancer Father   . Hypertension Father   . Cancer Sister     breast  . Cancer Brother     lung   History  Substance Use Topics  . Smoking status: Current Every Day Smoker -- 0.50 packs/day    Types: Cigarettes  . Smokeless tobacco: Not on file  . Alcohol Use: No     Comment: occasional     Review of Systems  Constitutional: Negative for fever.  Musculoskeletal: Positive for joint swelling and arthralgias. Negative for myalgias.  Neurological: Negative for  weakness and numbness.    Allergies  Hydrocodone-acetaminophen  Home Medications   Current Outpatient Rx  Name  Route  Sig  Dispense  Refill  . fish oil-omega-3 fatty acids 1000 MG capsule   Oral   Take 1 g by mouth daily.         Marland Kitchen gemfibrozil (LOPID) 600 MG tablet   Oral   Take 1 tablet (600 mg total) by mouth 2 (two) times daily before a meal.   60 tablet   6   . ibuprofen (ADVIL,MOTRIN) 800 MG tablet   Oral   Take 1 tablet (800 mg total) by mouth 3 (three) times daily.   90 tablet   6     Please take after eating.   . nicotine (NICODERM CQ - DOSED IN MG/24 HOURS) 21 mg/24hr patch   Transdermal   Place 1 patch onto the skin daily.         . chlorzoxazone (PARAFON FORTE DSC) 500 MG tablet   Oral   Take 1 tablet (500 mg total) by mouth 4 (four) times daily as needed for muscle spasms.   30 tablet   1     Caution drowsiness, for home use   . oxyCODONE-acetaminophen (PERCOCET/ROXICET) 5-325 MG  per tablet   Oral   Take 1 tablet by mouth every 4 (four) hours as needed for pain.   20 tablet   0   . oxyCODONE-acetaminophen (PERCOCET/ROXICET) 5-325 MG per tablet   Oral   Take 1 tablet by mouth every 4 (four) hours as needed for pain.   5 tablet   0    BP 143/89  Pulse 74  Temp(Src) 98.5 F (36.9 C) (Oral)  Resp 19  Ht 5\' 9"  (1.753 m)  Wt 221 lb (100.245 kg)  BMI 32.62 kg/m2  SpO2 98% Physical Exam  Constitutional: He appears well-developed and well-nourished.  HENT:  Head: Atraumatic.  Neck: Normal range of motion.  Cardiovascular:  Pulses equal bilaterally  Musculoskeletal: He exhibits tenderness.       Right knee: He exhibits decreased range of motion and swelling. He exhibits no effusion, no ecchymosis, no deformity, no erythema, normal alignment, no LCL laxity and no MCL laxity. Tenderness found. Medial joint line and lateral joint line tenderness noted. No patellar tendon tenderness noted.  Anterior pain with right knee flexion beyond approx  110 degrees.  Neurological: He is alert. He has normal strength. He displays normal reflexes. No sensory deficit.  Equal strength  Skin: Skin is warm and dry.  Psychiatric: He has a normal mood and affect.    ED Course   Procedures (including critical care time)  Labs Reviewed - No data to display Dg Knee Complete 4 Views Right  04/06/2013   *RADIOLOGY REPORT*  Clinical Data: Right knee pain, no trauma  RIGHT KNEE - COMPLETE 4+ VIEW  Comparison: None.  Findings: No fracture or dislocation.  No soft tissue abnormality. No radiopaque foreign body.  No suprapatellar effusion.  IMPRESSION: Normal exam.   Original Report Authenticated By: Christiana Pellant, M.D.   1. Recurrent knee pain, right     MDM  Patients labs and/or radiological studies were viewed and considered during the medical decision making and disposition process. Pt given reassurance there are no obvious injury to knee but would need f/u with his orthopedist if sx persist,  Encouraged ice, elevation, ibuprofen,  Oxycodone (as prescribed by his pcp today).  He does have crutches and knee brace at home which he was encouraged to use prn.  Burgess Amor, PA-C 04/07/13 1435

## 2013-04-26 ENCOUNTER — Emergency Department (HOSPITAL_COMMUNITY): Payer: Medicaid Other

## 2013-04-26 ENCOUNTER — Encounter (HOSPITAL_COMMUNITY): Payer: Self-pay | Admitting: Emergency Medicine

## 2013-04-26 ENCOUNTER — Emergency Department (HOSPITAL_COMMUNITY)
Admission: EM | Admit: 2013-04-26 | Discharge: 2013-04-26 | Disposition: A | Discharge status: Home / Self Care | Payer: Medicaid Other | Attending: Emergency Medicine | Admitting: Emergency Medicine

## 2013-04-26 DIAGNOSIS — Y990 Civilian activity done for income or pay: Secondary | ICD-10-CM | POA: Insufficient documentation

## 2013-04-26 DIAGNOSIS — Y9389 Activity, other specified: Secondary | ICD-10-CM | POA: Insufficient documentation

## 2013-04-26 DIAGNOSIS — Z8639 Personal history of other endocrine, nutritional and metabolic disease: Secondary | ICD-10-CM | POA: Insufficient documentation

## 2013-04-26 DIAGNOSIS — Z8719 Personal history of other diseases of the digestive system: Secondary | ICD-10-CM | POA: Insufficient documentation

## 2013-04-26 DIAGNOSIS — F17200 Nicotine dependence, unspecified, uncomplicated: Secondary | ICD-10-CM | POA: Insufficient documentation

## 2013-04-26 DIAGNOSIS — IMO0002 Reserved for concepts with insufficient information to code with codable children: Secondary | ICD-10-CM | POA: Insufficient documentation

## 2013-04-26 DIAGNOSIS — Z79899 Other long term (current) drug therapy: Secondary | ICD-10-CM | POA: Insufficient documentation

## 2013-04-26 DIAGNOSIS — M545 Low back pain: Secondary | ICD-10-CM

## 2013-04-26 DIAGNOSIS — J45909 Unspecified asthma, uncomplicated: Secondary | ICD-10-CM | POA: Insufficient documentation

## 2013-04-26 DIAGNOSIS — W11XXXA Fall on and from ladder, initial encounter: Secondary | ICD-10-CM | POA: Insufficient documentation

## 2013-04-26 DIAGNOSIS — Z8614 Personal history of Methicillin resistant Staphylococcus aureus infection: Secondary | ICD-10-CM | POA: Insufficient documentation

## 2013-04-26 DIAGNOSIS — Z862 Personal history of diseases of the blood and blood-forming organs and certain disorders involving the immune mechanism: Secondary | ICD-10-CM | POA: Insufficient documentation

## 2013-04-26 DIAGNOSIS — Y929 Unspecified place or not applicable: Secondary | ICD-10-CM | POA: Insufficient documentation

## 2013-04-26 MED ORDER — KETOROLAC TROMETHAMINE 60 MG/2ML IM SOLN
60.0000 mg | Freq: Once | INTRAMUSCULAR | Status: AC
  Administered 2013-04-26: 60 mg via INTRAMUSCULAR
  Filled 2013-04-26: qty 2

## 2013-04-26 NOTE — ED Notes (Signed)
Patient states he fell from a ladder this afternoon and c/o back pain.  Patient states he landed flat on his back.

## 2013-04-26 NOTE — ED Notes (Signed)
Pt alert & oriented x4, stable gait. Patient given discharge instructions, paperwork & prescription(s). Patient  instructed to stop at the registration desk to finish any additional paperwork. Patient verbalized understanding. Pt left department w/ no further questions. 

## 2013-04-26 NOTE — ED Notes (Signed)
Pt states fell on his back from about 10 feet at around 1500 today. Denies hitting head. Pt ambulates ok & moving all extremities w/ no complications.

## 2013-04-26 NOTE — ED Provider Notes (Signed)
CSN: [id number]     Arrival date & time 04/26/13  1947 History  This chart was scribed for Jared Quarry, MD by Bennett Scrape, ED Scribe. This patient was seen in room APA04/[facility]04 and the patient's care was started at 8:06 PM.   Chief Complaint  Patient presents with  . Fall  . Back Pain    Patient is a 37 y.o. male presenting with fall. The history is provided by the patient. No language interpreter was used.  Fall This is a new problem. The current episode started 3 to 5 hours ago. Episode frequency: once. The problem has been resolved. Pertinent negatives include no chest pain and no abdominal pain. Associated symptoms comments: Back pain. The symptoms are aggravated by walking. He has tried nothing for the symptoms.    HPI Comments: Jared Flowers is a 37 y.o. male who presents to the Emergency Department complaining of a fall off of a ladder while at work approximately 4 hours ago. He reports that he landed flat on his back but denies any head trauma or LOC. He c/o lower back pain currently that is worse with ambulation. Pt denies taking OTC medications at home to improve symptoms. He denies any urinary or bowel incontinence, numbness or weakness, CP, SOB or abdominal pain as associated symptoms. He denies that this is a worker's comp case. He reports that he is currently on any daily medications for HLD only.   Past Medical History  Diagnosis Date  . Acid reflux   . Joint pain   . Back pain   . Erosive esophagitis   . Asthma   . Hyperlipidemia   . MRSA (methicillin resistant Staphylococcus aureus)     admitted to hospital 2010   Past Surgical History  Procedure Laterality Date  . Knee surgery      right knee   Family History  Problem Relation Age of Onset  . Cancer    . Asthma    . Diabetes    . Kidney disease    . Hypertension Mother   . Cancer Father   . Hypertension Father   . Cancer Sister     breast  . Cancer Brother     lung   History  Substance Use  Topics  . Smoking status: Current Every Day Smoker -- 0.50 packs/day    Types: Cigarettes  . Smokeless tobacco: Not on file  . Alcohol Use: No     Comment: occasional     Review of Systems  Cardiovascular: Negative for chest pain.  Gastrointestinal: Negative for abdominal pain.  Musculoskeletal: Positive for back pain.  Neurological: Negative for syncope, weakness and numbness.  All other systems reviewed and are negative.    Allergies  Hydrocodone-acetaminophen  Home Medications   Current Outpatient Rx  Name  Route  Sig  Dispense  Refill  . chlorzoxazone (PARAFON FORTE DSC) 500 MG tablet   Oral   Take 1 tablet (500 mg total) by mouth 4 (four) times daily as needed for muscle spasms.   30 tablet   1     Caution drowsiness, for home use   . fish oil-omega-3 fatty acids 1000 MG capsule   Oral   Take 1 g by mouth daily.         Marland Kitchen gemfibrozil (LOPID) 600 MG tablet   Oral   Take 1 tablet (600 mg total) by mouth 2 (two) times daily before a meal.   60 tablet   6   .  ibuprofen (ADVIL,MOTRIN) 800 MG tablet   Oral   Take 1 tablet (800 mg total) by mouth 3 (three) times daily.   90 tablet   6     Please take after eating.   . nicotine (NICODERM CQ - DOSED IN MG/24 HOURS) 21 mg/24hr patch   Transdermal   Place 1 patch onto the skin daily.         Marland Kitchen oxyCODONE-acetaminophen (PERCOCET/ROXICET) 5-325 MG per tablet   Oral   Take 1 tablet by mouth every 4 (four) hours as needed for pain.   20 tablet   0    Triage Vitals: BP 154/110  Pulse 86  Temp(Src) 98.2 F (36.8 C) (Oral)  Resp 20  Ht 5' 10.5" (1.791 m)  Wt 222 lb (100.699 kg)  BMI 31.39 kg/m2  SpO2 100%  Physical Exam  Nursing note and vitals reviewed. Constitutional: He is oriented to person, place, and time. He appears well-developed and well-nourished. No distress.  HENT:  Head: Normocephalic and atraumatic.  Right Ear: External ear normal.  Left Ear: External ear normal.  Nose: Nose normal.   Mouth/Throat: Oropharynx is clear and moist.  Eyes: Conjunctivae and EOM are normal. Pupils are equal, round, and reactive to light.  Neck: Normal range of motion. No tracheal deviation present.  Cardiovascular: Normal rate, regular rhythm, normal heart sounds and intact distal pulses.   Pulmonary/Chest: Effort normal and breath sounds normal. No respiratory distress.  Abdominal: Soft. There is no tenderness.  Musculoskeletal: Normal range of motion. He exhibits no edema.  no external signs of trauma to the back, tenderness to palpation of the lumbar spine, no crepitus or step-offs, no cervical or thoracic spine tenderness   Neurological: He is alert and oriented to person, place, and time. No cranial nerve deficit.  5/5 strength to bilateral lower extremities, normal ankle, patellar and cremasteric reflexes, sensation is intact  Skin: Skin is warm and dry.  Psychiatric: He has a normal mood and affect. His behavior is normal. Judgment and thought content normal.    ED Course   DIAGNOSTIC STUDIES: Oxygen Saturation is 100% on room air, normal by my interpretation.    COORDINATION OF CARE: 8:10 PM-Discussed treatment plan which includes lumbar spine x-Genice Kimberlin with pt at bedside and pt agreed to plan.   Procedures (including critical care time)  Labs Reviewed - No data to display No results found. No diagnosis found.  MDM  No evidence of fracture of ls spine and no external signs of trauma Patient with multiple frequent prescriptions for narcotics for shoulder pain which is did not mention or complain of pain in his shoulder tonight.  He is advised to follow up with Dr. Gerda Diss if pain persists.  He is discharged to home in stable condition.   Jared Quarry, MD 04/26/13 2049

## 2013-04-27 ENCOUNTER — Encounter: Payer: Self-pay | Admitting: Family Medicine

## 2013-04-27 DIAGNOSIS — G8929 Other chronic pain: Secondary | ICD-10-CM | POA: Insufficient documentation

## 2013-04-28 ENCOUNTER — Ambulatory Visit: Payer: Medicaid Other | Admitting: Family Medicine

## 2013-04-30 ENCOUNTER — Ambulatory Visit (INDEPENDENT_AMBULATORY_CARE_PROVIDER_SITE_OTHER): Payer: Medicaid Other | Admitting: Family Medicine

## 2013-04-30 ENCOUNTER — Encounter: Payer: Self-pay | Admitting: Family Medicine

## 2013-04-30 VITALS — BP 122/82 | Ht 71.0 in | Wt 220.0 lb

## 2013-04-30 DIAGNOSIS — G8929 Other chronic pain: Secondary | ICD-10-CM

## 2013-04-30 DIAGNOSIS — M549 Dorsalgia, unspecified: Secondary | ICD-10-CM

## 2013-04-30 MED ORDER — IBUPROFEN 800 MG PO TABS: 800.0000 mg | ORAL_TABLET | Freq: Three times a day (TID) | ORAL | Status: DC

## 2013-04-30 MED ORDER — OXYCODONE-ACETAMINOPHEN 5-325 MG PO TABS: 1.0000 | ORAL_TABLET | Freq: Two times a day (BID) | ORAL | Status: DC | PRN

## 2013-04-30 NOTE — Progress Notes (Signed)
  Subjective:    Patient ID: DENALI BECVAR, male    DOB: 11-18-1975, 37 y.o.   MRN: [id number]  Back Pain This is a new problem. The current episode started in the past 7 days. The problem has been gradually improving since onset. The quality of the pain is described as aching.   patient has been to the ER several times because her right knee giving way low back pain discomfort. He states he uses Percocet approximately 2 per day he is requesting a prescription of this in order to allow him to be better treated for his pain.  Follow up from ER  Review of Systems  Musculoskeletal: Positive for back pain.   Denies any loss of bowel or bladder control    Objective:   Physical Exam Patient has moderate to severe low back tenderness on palpation negative straight leg raise difficulty bending also has subjective discomfort in the right knee no laxity .       Assessment & Plan:  Chronic low back pain- percocet 5/325 #60 , may refill in 1 month, ibuprofen 800 mg 3 times a day ongoing, he would like to see a pain management specialist to see if he needed better overall improvement on the level of his pain. I don't recommend MRI currently or other intervention at the moment.  Patient with right knee weakness he states is giveaway several times he will talk with the orthopedist about getting a brace.

## 2013-05-05 ENCOUNTER — Encounter: Payer: Self-pay | Admitting: Physical Medicine & Rehabilitation

## 2013-05-18 ENCOUNTER — Encounter: Payer: Self-pay | Admitting: Physical Medicine & Rehabilitation

## 2013-05-18 ENCOUNTER — Encounter: Payer: Medicaid Other | Attending: Physical Medicine & Rehabilitation

## 2013-05-18 ENCOUNTER — Ambulatory Visit (HOSPITAL_BASED_OUTPATIENT_CLINIC_OR_DEPARTMENT_OTHER): Payer: Medicaid Other | Admitting: Physical Medicine & Rehabilitation

## 2013-05-18 VITALS — BP 138/83 | HR 69

## 2013-05-18 DIAGNOSIS — M489 Spondylopathy, unspecified: Secondary | ICD-10-CM

## 2013-05-18 DIAGNOSIS — M999 Biomechanical lesion, unspecified: Secondary | ICD-10-CM

## 2013-05-18 DIAGNOSIS — Z5181 Encounter for therapeutic drug level monitoring: Secondary | ICD-10-CM

## 2013-05-18 DIAGNOSIS — Z79899 Other long term (current) drug therapy: Secondary | ICD-10-CM

## 2013-05-18 DIAGNOSIS — M545 Low back pain, unspecified: Secondary | ICD-10-CM | POA: Insufficient documentation

## 2013-05-18 DIAGNOSIS — M25561 Pain in right knee: Secondary | ICD-10-CM

## 2013-05-18 DIAGNOSIS — M549 Dorsalgia, unspecified: Secondary | ICD-10-CM

## 2013-05-18 DIAGNOSIS — M25569 Pain in unspecified knee: Secondary | ICD-10-CM

## 2013-05-18 NOTE — Patient Instructions (Addendum)
Back Pain, Adult Low back pain is very common. About 1 in 5 people have back pain.The cause of low back pain is rarely dangerous. The pain often gets better over time.About half of people with a sudden onset of back pain feel better in just 2 weeks. About 8 in 10 people feel better by 6 weeks.  CAUSES Some common causes of back pain include:  Strain of the muscles or ligaments supporting the spine.  Wear and tear (degeneration) of the spinal discs.  Arthritis.  Direct injury to the back. DIAGNOSIS Most of the time, the direct cause of low back pain is not known.However, back pain can be treated effectively even when the exact cause of the pain is unknown.Answering your caregiver's questions about your overall health and symptoms is one of the most accurate ways to make sure the cause of your pain is not dangerous. If your caregiver needs more information, he or she may order lab work or imaging tests (X-rays or MRIs).However, even if imaging tests show changes in your back, this usually does not require surgery. HOME CARE INSTRUCTIONS For many people, back pain returns.Since low back pain is rarely dangerous, it is often a condition that people can learn to [facility] their own.   Remain active. It is stressful on the back to sit or stand in one place. Do not sit, drive, or stand in one place for more than 30 minutes at a time. Take short walks on level surfaces as soon as pain allows.Try to increase the length of time you walk each day.  Do not stay in bed.Resting more than 1 or 2 days can delay your recovery.  Do not avoid exercise or work.Your body is made to move.It is not dangerous to be active, even though your back may hurt.Your back will likely heal faster if you return to being active before your pain is gone.  Pay attention to your body when you bend and lift. Many people have less discomfortwhen lifting if they bend their knees, keep the load close to their bodies,and  avoid twisting. Often, the most comfortable positions are those that put less stress on your recovering back.  Find a comfortable position to sleep. Use a firm mattress and lie on your side with your knees slightly bent. If you lie on your back, put a pillow under your knees.  Only take over-the-counter or prescription medicines as directed by your caregiver. Over-the-counter medicines to reduce pain and inflammation are often the most helpful.Your caregiver may prescribe muscle relaxant drugs.These medicines help dull your pain so you can more quickly return to your normal activities and healthy exercise.  Put ice on the injured area.  Put ice in a plastic bag.  Place a towel between your skin and the bag.  Leave the ice on for 15-20 minutes, 3-4 times a day for the first 2 to 3 days. After that, ice and heat may be alternated to reduce pain and spasms.  Ask your caregiver about trying back exercises and gentle massage. This may be of some benefit.  Avoid feeling anxious or stressed.Stress increases muscle tension and can worsen back pain.It is important to recognize when you are anxious or stressed and learn ways to manage it.Exercise is a great option. SEEK MEDICAL CARE IF:  You have pain that is not relieved with rest or medicine.  You have pain that does not improve in 1 week.  You have new symptoms.  You are generally not feeling well. SEEK  IMMEDIATE MEDICAL CARE IF:   You have pain that radiates from your back into your legs.  You develop new bowel or bladder control problems.  You have unusual weakness or numbness in your arms or legs.  You develop nausea or vomiting.  You develop abdominal pain.  You feel faint. Document Released: 08/26/2005 Document Revised: 02/25/2012 Document Reviewed: 01/14/2011 [facility] Patient Information 2014 [place], [place].  I would not recommend staying on Long term narcotics for this problem and would not prescribe them.  It is  likely to come and go.  It is unlikely that surgery will help. We can discuss further treatment options after MRI performed

## 2013-05-18 NOTE — Progress Notes (Signed)
  Subjective:    Patient ID: Jared Flowers, male    DOB: [birth year], 37 y.o.   MRN: [id number]  HPI  Pain Inventory Average Pain 9 Pain Right Now 10 My pain is stabbing and aching  In the last 24 hours, has pain interfered with the following? General activity 5 Relation with others 5 Enjoyment of life 8 What TIME of day is your pain at its worst? morning and night Sleep (in general) Poor  Pain is worse with: walking, bending and standing Pain improves with: rest, heat/ice and medication Relief from Meds: 10  Mobility walk without assistance use a cane how many minutes can you walk? 5 ability to climb steps?  yes do you drive?  no  Function not employed: date last employed 11/2012--had knee surgery and job wouldn't let him come back  Neuro/Psych weakness tremor trouble walking  Prior Studies Any changes since last visit?  no  Physicians involved in your care Any changes since last visit?  no   Family History  Problem Relation Age of Onset  . Cancer    . Asthma    . Diabetes    . Kidney disease    . Hypertension Mother   . Cancer Father   . Hypertension Father   . Cancer Sister     breast  . Cancer Brother     lung   History   Social History  . Marital Status: Divorced    Spouse Name: N/A    Number of Children: N/A  . Years of Education: N/A   Social History Main Topics  . Smoking status: Current Every Day Smoker -- 0.50 packs/day    Types: Cigarettes  . Smokeless tobacco: Current User    Types: Snuff  . Alcohol Use: No     Comment: occasional   . Drug Use: No  . Sexual Activity: None   Other Topics Concern  . None   Social History Narrative  . None   Past Surgical History  Procedure Laterality Date  . Knee surgery      right knee   Past Medical History  Diagnosis Date  . Acid reflux   . Joint pain   . Back pain   . Erosive esophagitis   . Asthma   . Hyperlipidemia   . MRSA (methicillin resistant Staphylococcus aureus)     admitted to hospital 2010   BP 138/83  Pulse 69  SpO2 97%    Review of Systems  Musculoskeletal: Positive for back pain and gait problem.  Neurological: Positive for tremors.  All other systems reviewed and are negative.       Objective:   Physical Exam        Assessment & Plan:

## 2013-05-18 NOTE — Progress Notes (Signed)
  Subjective:    Jared Flowers is a 37 y.o. male who presents for evaluation of low back pain. The patient has had no prior back problems. Symptoms have been present for 4 years and are intermittent.  Onset was related to / precipitated by no known injury. The pain is located in the across the lower back and does not radiate. The pain is described as aching, throbbing and no tingling and occurs intermittently. He is currently in no pain. Symptoms are exacerbated by standing for more than 20 minutes. Symptoms are improved by lays on hard surface. He has also tried change in body position, heat, NSAIDs and physical therapy twice a month which provided no symptom relief. He has no other symptoms associated with the back pain. The patient has no "red flag" history indicative of complicated back pain.  The following portions of the patient's history were reviewed and updated as appropriate: allergies, current medications, past family history, past medical history, past social history, past surgical history and problem list.  Review of Systems Pertinent items are noted in HPI.    Objective:   Normal reflexes, gait, strength and negative straight-leg raise. Inspection and palpation: inspection of back is normal, paraspinal tenderness noted Bilateral L5-S1 paraspinal. Muscle tone and ROM exam: muscle tone normal without spasm, limited range of motion with pain. Straight leg raise: negative at 30 degrees bilaterally. Neurological: normal DTRs, muscle strength and reflexes.   Pain with forward flexion with limited range of motion with forward flexion. Extension and lateral bending are normal range of motion without pain. Posture is normal. FABER's negative Assessment:    Lumbar axial pain no sign of radiculopathy.  Chronic symptoms which have not resolved with PT and medication.  Recommend MRI to further assess.  At this point Xray and exam unremarkable would not Rx narcotic analgesics   Plan:     Natural history and expected course discussed. Questions answered. Short (2-4 day) period of relative rest recommended until acute symptoms improve. MRI of the affected area due to presence of chronic and severe unremitting symptoms. Follow-up in 2 weeks.

## 2013-05-20 NOTE — Progress Notes (Signed)
Notify pt to schedule MRI lumbar

## 2013-05-28 ENCOUNTER — Ambulatory Visit: Payer: Medicaid Other | Admitting: Physical Medicine & Rehabilitation

## 2013-05-28 ENCOUNTER — Telehealth: Payer: Self-pay | Admitting: Family Medicine

## 2013-05-28 NOTE — Telephone Encounter (Signed)
Patient seen pain doctor on 05/18/13 and in the note the doctor stated that he does not recommend long term use of narcotics for this problem and would not prescribe them.

## 2013-05-28 NOTE — Telephone Encounter (Signed)
Notified patient he can always get a second opinion BUT doctor can not prescribe him pain meds. Patient verbalized understanding.

## 2013-05-28 NOTE — Telephone Encounter (Signed)
And neither will we. He can always get a second opinion BUT I can not prescribe him pain meds.

## 2013-05-28 NOTE — Telephone Encounter (Signed)
Patient says that he has went and seen one of the doctors in [place], but they are not scheduled to take care of his meds for 30 days. He is requesting another refill of oxycodone.

## 2013-05-31 ENCOUNTER — Ambulatory Visit (HOSPITAL_COMMUNITY): Payer: Medicaid Other

## 2013-06-08 ENCOUNTER — Ambulatory Visit: Payer: Medicaid Other | Admitting: Physical Medicine & Rehabilitation

## 2013-06-22 ENCOUNTER — Ambulatory Visit (HOSPITAL_COMMUNITY): Payer: Medicaid Other

## 2013-06-25 ENCOUNTER — Ambulatory Visit: Payer: Medicaid Other | Admitting: Physical Medicine & Rehabilitation

## 2013-07-19 ENCOUNTER — Encounter (HOSPITAL_COMMUNITY): Payer: Self-pay | Admitting: Emergency Medicine

## 2013-07-19 ENCOUNTER — Emergency Department (HOSPITAL_COMMUNITY)
Admission: EM | Admit: 2013-07-19 | Discharge: 2013-07-19 | Disposition: A | Discharge status: Home / Self Care | Payer: Medicaid Other | Attending: Emergency Medicine | Admitting: Emergency Medicine

## 2013-07-19 DIAGNOSIS — F17200 Nicotine dependence, unspecified, uncomplicated: Secondary | ICD-10-CM | POA: Insufficient documentation

## 2013-07-19 DIAGNOSIS — Z79899 Other long term (current) drug therapy: Secondary | ICD-10-CM | POA: Insufficient documentation

## 2013-07-19 DIAGNOSIS — Z9889 Other specified postprocedural states: Secondary | ICD-10-CM | POA: Insufficient documentation

## 2013-07-19 DIAGNOSIS — Z8719 Personal history of other diseases of the digestive system: Secondary | ICD-10-CM | POA: Insufficient documentation

## 2013-07-19 DIAGNOSIS — M25569 Pain in unspecified knee: Secondary | ICD-10-CM | POA: Insufficient documentation

## 2013-07-19 DIAGNOSIS — J45909 Unspecified asthma, uncomplicated: Secondary | ICD-10-CM | POA: Insufficient documentation

## 2013-07-19 DIAGNOSIS — G8929 Other chronic pain: Secondary | ICD-10-CM | POA: Insufficient documentation

## 2013-07-19 DIAGNOSIS — Z8614 Personal history of Methicillin resistant Staphylococcus aureus infection: Secondary | ICD-10-CM | POA: Insufficient documentation

## 2013-07-19 DIAGNOSIS — E785 Hyperlipidemia, unspecified: Secondary | ICD-10-CM | POA: Insufficient documentation

## 2013-07-19 DIAGNOSIS — M25469 Effusion, unspecified knee: Secondary | ICD-10-CM | POA: Insufficient documentation

## 2013-07-19 MED ORDER — IBUPROFEN 600 MG PO TABS: 600.0000 mg | ORAL_TABLET | Freq: Four times a day (QID) | ORAL | Status: DC | PRN

## 2013-07-19 MED ORDER — OXYCODONE-ACETAMINOPHEN 5-325 MG PO TABS: 1.0000 | ORAL_TABLET | ORAL | Status: DC | PRN

## 2013-07-19 NOTE — ED Notes (Signed)
Pain rt knee x 2 days, Had knee surgery 8 mos ago in [place]

## 2013-07-20 NOTE — ED Provider Notes (Signed)
Medical screening examination/treatment/procedure(s) were performed by non-physician practitioner and as supervising physician I was immediately available for consultation/collaboration.  EKG Interpretation   None        Hyun Marsalis, MD 07/20/13 1107 

## 2013-07-20 NOTE — ED Provider Notes (Signed)
CSN: [id number]     Arrival date & time 07/19/13  1139 History   First MD Initiated Contact with Patient 07/19/13 1248     Chief Complaint  Patient presents with  . Knee Pain   (Consider location/radiation/quality/duration/timing/severity/associated sxs/prior Treatment) HPI Comments: Jared Flowers is a 37 y.o. Male presenting with right knee pain and swelling which he woke with today.  He is currently 8 months out from right orthoscopic knee surgery by Dr. Cleophas Dunker during which his medial collateral ligament was repaired, per patient report.  He denies new injury or overuse over the weekend, but woke this morning with swelling and nonradiating pain.  He has applied ice which has resolved the swelling. He has contacted his pcp, but cannot be seen for 2 days.  He has tried no pain relievers today. Pain is sharp and worsened with movement.  He denies fevers, chills or nausea and otherwise feels well.    The history is provided by the patient.    Past Medical History  Diagnosis Date  . Acid reflux   . Joint pain   . Back pain   . Erosive esophagitis   . Asthma   . Hyperlipidemia   . MRSA (methicillin resistant Staphylococcus aureus)     admitted to hospital 2010   Past Surgical History  Procedure Laterality Date  . Knee surgery      right knee   Family History  Problem Relation Age of Onset  . Cancer    . Asthma    . Diabetes    . Kidney disease    . Hypertension Mother   . Cancer Father   . Hypertension Father   . Cancer Sister     breast  . Cancer Brother     lung   History  Substance Use Topics  . Smoking status: Current Every Day Smoker -- 0.50 packs/day    Types: Cigarettes  . Smokeless tobacco: Current User    Types: Snuff  . Alcohol Use: No     Comment: occasional     Review of Systems  Constitutional: Negative for fever.  Musculoskeletal: Positive for arthralgias and joint swelling. Negative for myalgias.  Skin: Negative for color change and wound.   Neurological: Negative for weakness and numbness.    Allergies  Hydrocodone-acetaminophen  Home Medications   Current Outpatient Rx  Name  Route  Sig  Dispense  Refill  . gemfibrozil (LOPID) 600 MG tablet   Oral   Take 1 tablet (600 mg total) by mouth 2 (two) times daily before a meal.   60 tablet   6   . ibuprofen (ADVIL,MOTRIN) 600 MG tablet   Oral   Take 1 tablet (600 mg total) by mouth every 6 (six) hours as needed.   30 tablet   0   . oxyCODONE-acetaminophen (PERCOCET/ROXICET) 5-325 MG per tablet   Oral   Take 1 tablet by mouth every 4 (four) hours as needed for severe pain.   15 tablet   0    BP 126/82  Pulse 76  Temp(Src) 98.2 F (36.8 C) (Oral)  Resp 20  Ht 5' 10.5" (1.791 m)  Wt 208 lb (94.348 kg)  BMI 29.41 kg/m2  SpO2 100% Physical Exam  Constitutional: He appears well-developed and well-nourished.  HENT:  Head: Atraumatic.  Neck: Normal range of motion.  Cardiovascular:  Pulses:      Dorsalis pedis pulses are 2+ on the right side, and 2+ on the left side.  Pulses equal  bilaterally  Musculoskeletal: He exhibits tenderness. He exhibits no edema.       Right knee: He exhibits decreased range of motion. He exhibits no swelling, no effusion, no deformity, no erythema, normal alignment, no LCL laxity, normal patellar mobility and no MCL laxity. Tenderness found. Medial joint line tenderness noted.  Neurological: He is alert. He has normal strength. He displays normal reflexes. No sensory deficit.  Equal strength  Skin: Skin is warm and dry. No abrasion, no ecchymosis and no lesion noted. No erythema.  Psychiatric: He has a normal mood and affect.    ED Course  Procedures (including critical care time) Labs Review Labs Reviewed - No data to display Imaging Review No results found.  EKG Interpretation   None       MDM   1. Knee pain, chronic, right    Patient with acute on chronic right knee pain,  ttp medial joint line, no visible or  palpable deformity, no erythema, doubt septic joint.  Pt was prescribed ibuprofen, oxycodone, encouraged recheck as scheduled by pcp in 2 days.    Burgess Amor, PA-C 07/20/13 [phone]

## 2013-08-18 ENCOUNTER — Other Ambulatory Visit: Payer: Self-pay | Admitting: Family Medicine

## 2013-08-18 ENCOUNTER — Emergency Department (HOSPITAL_COMMUNITY)
Admission: EM | Admit: 2013-08-18 | Discharge: 2013-08-18 | Disposition: A | Discharge status: Home / Self Care | Payer: Medicaid Other | Attending: Emergency Medicine | Admitting: Emergency Medicine

## 2013-08-18 ENCOUNTER — Encounter (HOSPITAL_COMMUNITY): Payer: Self-pay | Admitting: Emergency Medicine

## 2013-08-18 DIAGNOSIS — F17200 Nicotine dependence, unspecified, uncomplicated: Secondary | ICD-10-CM | POA: Insufficient documentation

## 2013-08-18 DIAGNOSIS — M25569 Pain in unspecified knee: Secondary | ICD-10-CM | POA: Insufficient documentation

## 2013-08-18 DIAGNOSIS — Z9889 Other specified postprocedural states: Secondary | ICD-10-CM | POA: Insufficient documentation

## 2013-08-18 DIAGNOSIS — E785 Hyperlipidemia, unspecified: Secondary | ICD-10-CM | POA: Insufficient documentation

## 2013-08-18 DIAGNOSIS — Z8614 Personal history of Methicillin resistant Staphylococcus aureus infection: Secondary | ICD-10-CM | POA: Insufficient documentation

## 2013-08-18 DIAGNOSIS — G8929 Other chronic pain: Secondary | ICD-10-CM | POA: Insufficient documentation

## 2013-08-18 DIAGNOSIS — Z79899 Other long term (current) drug therapy: Secondary | ICD-10-CM | POA: Insufficient documentation

## 2013-08-18 DIAGNOSIS — Z8719 Personal history of other diseases of the digestive system: Secondary | ICD-10-CM | POA: Insufficient documentation

## 2013-08-18 DIAGNOSIS — J45909 Unspecified asthma, uncomplicated: Secondary | ICD-10-CM | POA: Insufficient documentation

## 2013-08-18 DIAGNOSIS — M25561 Pain in right knee: Secondary | ICD-10-CM

## 2013-08-18 MED ORDER — OXYCODONE-ACETAMINOPHEN 5-325 MG PO TABS
2.0000 | ORAL_TABLET | Freq: Once | ORAL | Status: AC
  Administered 2013-08-18: 2 via ORAL
  Filled 2013-08-18: qty 2

## 2013-08-18 MED ORDER — OXYCODONE-ACETAMINOPHEN 5-325 MG PO TABS: 1.0000 | ORAL_TABLET | Freq: Four times a day (QID) | ORAL | Status: DC | PRN

## 2013-08-18 NOTE — ED Provider Notes (Signed)
CSN: [id number]     Arrival date & time 08/18/13  1708 History   First MD Initiated Contact with Patient 08/18/13 1717     Chief Complaint  Patient presents with  . Knee Pain    rt knee   (Consider location/radiation/quality/duration/timing/severity/associated sxs/prior Treatment) HPI Pt is a 37yo male with hx of chronic right knee problems s/p right knee surgery 22mo ago by Dr. Bridgett Larsson to repair pt's medial collateral ligament was repaired per pt.  Pt states yesterday he noticed increased pain and stiffness in right knee.  Is concerned he has reinjured his MCL and is in the process of reestablishing care with his orthopedist in [place].  Pt states his PCP has already made the referral, he last spoke with the orthopedist office Friday, 12/5, and currently waiting to be scheduled for an appointment.  Pain is constant, throbbing, 10/10, sharpens and worsens with bending of his right knee. Denies falls, twisting, or other new injury of right knee.  Past Medical History  Diagnosis Date  . Acid reflux   . Joint pain   . Back pain   . Erosive esophagitis   . Asthma   . Hyperlipidemia   . MRSA (methicillin resistant Staphylococcus aureus)     admitted to hospital 2010   Past Surgical History  Procedure Laterality Date  . Knee surgery      right knee   Family History  Problem Relation Age of Onset  . Cancer    . Asthma    . Diabetes    . Kidney disease    . Hypertension Mother   . Cancer Father   . Hypertension Father   . Cancer Sister     breast  . Cancer Brother     lung   History  Substance Use Topics  . Smoking status: Current Every Day Smoker -- 0.50 packs/day    Types: Cigarettes  . Smokeless tobacco: Current User    Types: Snuff  . Alcohol Use: No     Comment: occasional     Review of Systems  Constitutional: Negative for fever and chills.  Musculoskeletal: Positive for arthralgias and gait problem ( "cant bend knee because it hurts"). Negative for myalgias.   Skin: Negative for color change, rash and wound.  All other systems reviewed and are negative.    Allergies  Hydrocodone-acetaminophen  Home Medications   Current Outpatient Rx  Name  Route  Sig  Dispense  Refill  . gemfibrozil (LOPID) 600 MG tablet   Oral   Take 1 tablet (600 mg total) by mouth 2 (two) times daily before a meal.   60 tablet   6   . ibuprofen (ADVIL,MOTRIN) 600 MG tablet   Oral   Take 1 tablet (600 mg total) by mouth every 6 (six) hours as needed.   30 tablet   0   . oxyCODONE-acetaminophen (PERCOCET/ROXICET) 5-325 MG per tablet   Oral   Take 1 tablet by mouth every 4 (four) hours as needed for severe pain.   15 tablet   0   . oxyCODONE-acetaminophen (PERCOCET/ROXICET) 5-325 MG per tablet   Oral   Take 1-2 tablets by mouth every 6 (six) hours as needed for moderate pain or severe pain.   10 tablet   0    BP 142/77  Pulse 81  Temp(Src) 98.3 F (36.8 C) (Oral)  Resp 17  Ht 5\' 11"  (1.803 m)  Wt 210 lb (95.255 kg)  BMI 29.30 kg/m2  SpO2 99% Physical  Exam  Nursing note and vitals reviewed. Constitutional: He is oriented to person, place, and time. He appears well-developed and well-nourished.  HENT:  Head: Normocephalic and atraumatic.  Eyes: EOM are normal.  Neck: Normal range of motion.  Cardiovascular: Normal rate.   Pulmonary/Chest: Effort normal.  Musculoskeletal: He exhibits tenderness. He exhibits no edema.  Pt has straight legged antalgic gait into exam room.  No edema, ecchymosis or erythema of right knee. Tenderness to medial aspect of knee including medial joint space.  Limited knee flexion due to pain.  Neurological: He is alert and oriented to person, place, and time.  Skin: Skin is warm and dry. No erythema.  Psychiatric: He has a normal mood and affect. His behavior is normal.    ED Course  Procedures (including critical care time) Labs Review Labs Reviewed - No data to display Imaging Review No results found.  EKG  Interpretation   None       MDM   1. Chronic knee pain, right    Pt with hx of chronic right knee problems requesting pain relief while waiting for appointment with orthopedist in [place].  Denies knee injuries. No evidence of cellulitis or septic joint. Do not believe further imaging or workup is needed at this time. Discussed use of knee sleeve in which pt can purchase OTC, this may provide additional comfort. Rx: percocet #15.    Junius Finner, PA-C 08/18/13 1744

## 2013-08-18 NOTE — ED Provider Notes (Signed)
Medical screening examination/treatment/procedure(s) were performed by non-physician practitioner and as supervising physician I was immediately available for consultation/collaboration.  EKG Interpretation   None        Perline Awe W. Oneida Mckamey, MD 08/18/13 1804 

## 2013-08-18 NOTE — ED Notes (Signed)
Pt has had rt knee repair previously, pt denies injury states rt knee just started hurting

## 2013-08-26 ENCOUNTER — Telehealth: Payer: Self-pay | Admitting: Family Medicine

## 2013-08-26 MED ORDER — IBUPROFEN 600 MG PO TABS: 600.0000 mg | ORAL_TABLET | Freq: Four times a day (QID) | ORAL | Status: DC | PRN

## 2013-08-26 NOTE — Telephone Encounter (Signed)
May refill ibuprofen times 4

## 2013-08-26 NOTE — Telephone Encounter (Signed)
Patient says he is in desperate need of his ibuprofen and would like a refill to [place]

## 2013-08-26 NOTE — Telephone Encounter (Signed)
Med sent to pharm. Pt notified.  

## 2013-09-08 ENCOUNTER — Emergency Department (HOSPITAL_COMMUNITY)
Admission: EM | Admit: 2013-09-08 | Discharge: 2013-09-08 | Disposition: A | Discharge status: Home / Self Care | Payer: Medicaid Other | Attending: Emergency Medicine | Admitting: Emergency Medicine

## 2013-09-08 ENCOUNTER — Encounter (HOSPITAL_COMMUNITY): Payer: Self-pay | Admitting: Emergency Medicine

## 2013-09-08 DIAGNOSIS — Z8719 Personal history of other diseases of the digestive system: Secondary | ICD-10-CM | POA: Insufficient documentation

## 2013-09-08 DIAGNOSIS — Z8614 Personal history of Methicillin resistant Staphylococcus aureus infection: Secondary | ICD-10-CM | POA: Insufficient documentation

## 2013-09-08 DIAGNOSIS — F17200 Nicotine dependence, unspecified, uncomplicated: Secondary | ICD-10-CM | POA: Insufficient documentation

## 2013-09-08 DIAGNOSIS — IMO0002 Reserved for concepts with insufficient information to code with codable children: Secondary | ICD-10-CM | POA: Insufficient documentation

## 2013-09-08 DIAGNOSIS — X500XXA Overexertion from strenuous movement or load, initial encounter: Secondary | ICD-10-CM | POA: Insufficient documentation

## 2013-09-08 DIAGNOSIS — Y929 Unspecified place or not applicable: Secondary | ICD-10-CM | POA: Insufficient documentation

## 2013-09-08 DIAGNOSIS — Y9389 Activity, other specified: Secondary | ICD-10-CM | POA: Insufficient documentation

## 2013-09-08 DIAGNOSIS — Z8739 Personal history of other diseases of the musculoskeletal system and connective tissue: Secondary | ICD-10-CM | POA: Insufficient documentation

## 2013-09-08 DIAGNOSIS — T148XXA Other injury of unspecified body region, initial encounter: Secondary | ICD-10-CM

## 2013-09-08 DIAGNOSIS — S46911A Strain of unspecified muscle, fascia and tendon at shoulder and upper arm level, right arm, initial encounter: Secondary | ICD-10-CM

## 2013-09-08 DIAGNOSIS — J45909 Unspecified asthma, uncomplicated: Secondary | ICD-10-CM | POA: Insufficient documentation

## 2013-09-08 DIAGNOSIS — Z79899 Other long term (current) drug therapy: Secondary | ICD-10-CM | POA: Insufficient documentation

## 2013-09-08 DIAGNOSIS — Z8639 Personal history of other endocrine, nutritional and metabolic disease: Secondary | ICD-10-CM | POA: Insufficient documentation

## 2013-09-08 DIAGNOSIS — Z862 Personal history of diseases of the blood and blood-forming organs and certain disorders involving the immune mechanism: Secondary | ICD-10-CM | POA: Insufficient documentation

## 2013-09-08 MED ORDER — METHOCARBAMOL 500 MG PO TABS: 1000.0000 mg | ORAL_TABLET | Freq: Four times a day (QID) | ORAL | Status: AC

## 2013-09-08 MED ORDER — TRAMADOL HCL 50 MG PO TABS: 50.0000 mg | ORAL_TABLET | Freq: Four times a day (QID) | ORAL | Status: DC | PRN

## 2013-09-08 MED ORDER — TRAMADOL HCL 50 MG PO TABS
50.0000 mg | ORAL_TABLET | Freq: Once | ORAL | Status: AC
  Administered 2013-09-08: 50 mg via ORAL
  Filled 2013-09-08: qty 1

## 2013-09-08 MED ORDER — METHOCARBAMOL 500 MG PO TABS
1000.0000 mg | ORAL_TABLET | Freq: Once | ORAL | Status: AC
  Administered 2013-09-08: 1000 mg via ORAL
  Filled 2013-09-08: qty 2

## 2013-09-08 NOTE — ED Notes (Signed)
Pt c/o r shoulder pain since moving furniture last night.

## 2013-09-08 NOTE — ED Provider Notes (Signed)
CSN: [id number]     Arrival date & time 09/08/13  1022 History   First MD Initiated Contact with Patient 09/08/13 1043     Chief Complaint  Patient presents with  . Shoulder Pain   (Consider location/radiation/quality/duration/timing/severity/associated sxs/prior Treatment) HPI Comments: Jared Flowers is a 37 y.o. Male with a history of right rotator cuff injury under the care of Dr. Cleophas Flowers for this condition (and an injury to his right knee as well).  He was moving furniture in his home yesterday when he suspects he strained the shoulder.  He went to bed last night relatively comfortable, but woke today with pain around the joint, worse with movement and palpation.  He has taken ibuprofen without improvement.  He denies pain, weakness or numbness distal to the shoulder.       The history is provided by the patient.    Past Medical History  Diagnosis Date  . Acid reflux   . Joint pain   . Back pain   . Erosive esophagitis   . Asthma   . Hyperlipidemia   . MRSA (methicillin resistant Staphylococcus aureus)     admitted to hospital 2010   Past Surgical History  Procedure Laterality Date  . Knee surgery      right knee   Family History  Problem Relation Age of Onset  . Cancer    . Asthma    . Diabetes    . Kidney disease    . Hypertension Mother   . Cancer Father   . Hypertension Father   . Cancer Sister     breast  . Cancer Brother     lung   History  Substance Use Topics  . Smoking status: Current Every Day Smoker -- 0.50 packs/day    Types: Cigarettes  . Smokeless tobacco: Current User    Types: Snuff  . Alcohol Use: No     Comment: occasional     Review of Systems  Constitutional: Negative for fever.  Musculoskeletal: Positive for arthralgias. Negative for joint swelling and myalgias.  Neurological: Negative for weakness and numbness.    Allergies  Hydrocodone-acetaminophen  Home Medications   Current Outpatient Rx  Name  Route  Sig   Dispense  Refill  . ibuprofen (ADVIL,MOTRIN) 600 MG tablet   Oral   Take 1 tablet (600 mg total) by mouth every 6 (six) hours as needed.   30 tablet   3   . nicotine (NICODERM CQ - DOSED IN MG/24 HOURS) 21 mg/24hr patch   Transdermal   Place 21 mg onto the skin daily.         . methocarbamol (ROBAXIN) 500 MG tablet   Oral   Take 2 tablets (1,000 mg total) by mouth 4 (four) times daily.   40 tablet   0   . traMADol (ULTRAM) 50 MG tablet   Oral   Take 1 tablet (50 mg total) by mouth every 6 (six) hours as needed.   20 tablet   0    BP 134/81  Pulse 76  Temp(Src) 98.4 F (36.9 C) (Oral)  Resp 18  Ht 5\' 11"  (1.803 m)  Wt 210 lb (95.255 kg)  BMI 29.30 kg/m2  SpO2 99% Physical Exam  Constitutional: He appears well-developed and well-nourished.  HENT:  Head: Atraumatic.  Neck: Normal range of motion.  Cardiovascular:  Pulses equal bilaterally  Musculoskeletal: He exhibits tenderness.       Right shoulder: He exhibits tenderness and pain. He exhibits no  swelling, no effusion, no crepitus, no deformity, normal pulse and normal strength.  ttp anterior shoulder joint,  No deformity.  FROM passively with discomfort,  Worse with active ROM.    Neurological: He is alert. He has normal strength. He displays normal reflexes. No sensory deficit.  Equal strength  Skin: Skin is warm and dry.  Psychiatric: He has a normal mood and affect.    ED Course  Procedures (including critical care time) Labs Review Labs Reviewed - No data to display Imaging Review No results found.  EKG Interpretation   None       MDM   1. Shoulder strain, right, initial encounter   2. Muscle strain    Ice therapy, heat therapy.  F/u with Dr Jared Flowers next week (patient called his office this am,  Unable to see to next week so advised come here).  Robaxin, tramadol.    The patient appears reasonably screened and/or stabilized for discharge and I doubt any other medical condition or other [facility]  requiring further screening, evaluation, or treatment in the ED at this time prior to discharge.     Burgess Amor, PA-C 09/08/13 1240

## 2013-09-08 NOTE — ED Notes (Signed)
Patient with no complaints at this time. Respirations even and unlabored. Skin warm/dry. Discharge instructions reviewed with patient at this time. Patient given opportunity to voice concerns/ask questions. Patient discharged at this time and left Emergency Department with steady gait.   

## 2013-09-08 NOTE — ED Provider Notes (Signed)
Medical screening examination/treatment/procedure(s) were performed by non-physician practitioner and as supervising physician I was immediately available for consultation/collaboration.  EKG Interpretation   None         Stephani Janak B. Liadan Guizar, MD 09/08/13 1500 

## 2013-10-01 ENCOUNTER — Ambulatory Visit (INDEPENDENT_AMBULATORY_CARE_PROVIDER_SITE_OTHER): Payer: Medicaid Other | Admitting: Family Medicine

## 2013-10-01 ENCOUNTER — Encounter: Payer: Self-pay | Admitting: Family Medicine

## 2013-10-01 VITALS — BP 120/90 | Temp 98.6°F | Ht 71.0 in | Wt 214.0 lb

## 2013-10-01 DIAGNOSIS — J069 Acute upper respiratory infection, unspecified: Secondary | ICD-10-CM

## 2013-10-01 DIAGNOSIS — J019 Acute sinusitis, unspecified: Secondary | ICD-10-CM

## 2013-10-01 MED ORDER — PREDNISONE 20 MG PO TABS: ORAL_TABLET | ORAL | Status: DC

## 2013-10-01 MED ORDER — BENZONATATE 100 MG PO CAPS
100.0000 mg | ORAL_CAPSULE | Freq: Four times a day (QID) | ORAL | Status: DC | PRN
Start: 2013-10-01 — End: 2013-10-07

## 2013-10-01 MED ORDER — ALBUTEROL SULFATE HFA 108 (90 BASE) MCG/ACT IN AERS: 1.0000 | INHALATION_SPRAY | Freq: Four times a day (QID) | RESPIRATORY_TRACT | Status: AC | PRN

## 2013-10-01 MED ORDER — AZITHROMYCIN 250 MG PO TABS: ORAL_TABLET | ORAL | Status: DC

## 2013-10-01 NOTE — Progress Notes (Signed)
   Subjective:    Patient ID: Jared Flowers, male    DOB: Dec 18, 1975, 38 y.o.   MRN: [id number]  URI  This is a new problem. The current episode started in the past 7 days. The problem has been unchanged. There has been no fever. Associated symptoms include congestion, coughing, sneezing, a sore throat and wheezing. He has tried inhaler use for the symptoms. The treatment provided mild relief.   He relates some wheezing some head congestion drainage coughing doesn't feel good denies severe body aches denies high fevers PMH asthma  Review of Systems  HENT: Positive for congestion, sneezing and sore throat.   Respiratory: Positive for cough and wheezing.        Objective:   Physical Exam Ears normal throat normal, neck no masses, lungs are clear no crackles or respiratory distress, heart regular. Extremities no edema       Assessment & Plan:  Viral upper respiratory illness supportive measures discussed Probable acute sinusitis Zithromax Reactive airway refills on albuterol given prednisone taper given just in case he goes into a full-blown attack over the weekend warning signs discussed

## 2013-10-07 ENCOUNTER — Ambulatory Visit (INDEPENDENT_AMBULATORY_CARE_PROVIDER_SITE_OTHER): Payer: Medicaid Other | Admitting: Family Medicine

## 2013-10-07 ENCOUNTER — Encounter: Payer: Self-pay | Admitting: Family Medicine

## 2013-10-07 VITALS — BP 128/88 | Ht 71.0 in | Wt 214.0 lb

## 2013-10-07 DIAGNOSIS — S8000XA Contusion of unspecified knee, initial encounter: Secondary | ICD-10-CM

## 2013-10-07 DIAGNOSIS — S8002XA Contusion of left knee, initial encounter: Secondary | ICD-10-CM

## 2013-10-07 MED ORDER — OXYCODONE-ACETAMINOPHEN 10-325 MG PO TABS: 1.0000 | ORAL_TABLET | Freq: Three times a day (TID) | ORAL | Status: DC | PRN

## 2013-10-07 NOTE — Progress Notes (Signed)
   Subjective:    Patient ID: Jared Flowers, male    DOB: [birth year], 38 y.o.   MRN: [id number]  HPI this patient states that he was seen by the orthopedist yesterday for her shoulder was given Percocet 5 mg he states that does not help he did not get it filled he states he states that he once 10 mg cassette does much better for him. He also states he only wants enough to get through the next few weeks. He denies abusing it.  He also relates that he stepped in a hole twisted the knee causing some swelling in it.    Review of Systems Complains of shoulder pain knee pain and discomfort.    Objective:   Physical Exam  Subjective shoulder pain subjective knee pain and some swelling in the left knee. Ligaments stable      Assessment & Plan:  #1 knee contusion-should gradually get better but if it worsens go see his orthopedist.  #2 shoulder pain to do physical therapy as directed  #3 chronic pain-initially patient was given a prescription for Percocet 10 mg and told to take it as directed but he stated that he had not picked up his 5 mg prescription yet from [facility] so therefore I called over there and they in fact told him that it was it yesterday so I told the patient there is no way we could do that unless the other was turned in. So therefore the patient went up to the pharmacy to turn in 9 pills expecting that we would send a prescription for 30 tablets of 10 mg. There is no way we would do that. Also the patient told me that his wife picked up the medicine when in fact he picked up the medicine. Patient has been told before that we will not be providing him ongoing pain medicine I denied this pain medicine. Therefore the prescription for 30 tablets was never given to the patient.

## 2013-10-29 ENCOUNTER — Emergency Department (HOSPITAL_COMMUNITY)
Admission: EM | Admit: 2013-10-29 | Discharge: 2013-10-29 | Disposition: A | Discharge status: Home / Self Care | Payer: Medicaid Other | Attending: Emergency Medicine | Admitting: Emergency Medicine

## 2013-10-29 ENCOUNTER — Encounter (HOSPITAL_COMMUNITY): Payer: Self-pay | Admitting: Emergency Medicine

## 2013-10-29 ENCOUNTER — Emergency Department (HOSPITAL_COMMUNITY): Payer: Medicaid Other

## 2013-10-29 DIAGNOSIS — F17200 Nicotine dependence, unspecified, uncomplicated: Secondary | ICD-10-CM | POA: Insufficient documentation

## 2013-10-29 DIAGNOSIS — Z862 Personal history of diseases of the blood and blood-forming organs and certain disorders involving the immune mechanism: Secondary | ICD-10-CM | POA: Insufficient documentation

## 2013-10-29 DIAGNOSIS — Z8639 Personal history of other endocrine, nutritional and metabolic disease: Secondary | ICD-10-CM | POA: Insufficient documentation

## 2013-10-29 DIAGNOSIS — Z96619 Presence of unspecified artificial shoulder joint: Secondary | ICD-10-CM | POA: Insufficient documentation

## 2013-10-29 DIAGNOSIS — J45909 Unspecified asthma, uncomplicated: Secondary | ICD-10-CM | POA: Insufficient documentation

## 2013-10-29 DIAGNOSIS — M25511 Pain in right shoulder: Secondary | ICD-10-CM

## 2013-10-29 DIAGNOSIS — Z79899 Other long term (current) drug therapy: Secondary | ICD-10-CM | POA: Insufficient documentation

## 2013-10-29 DIAGNOSIS — Z8719 Personal history of other diseases of the digestive system: Secondary | ICD-10-CM | POA: Insufficient documentation

## 2013-10-29 DIAGNOSIS — M25519 Pain in unspecified shoulder: Secondary | ICD-10-CM | POA: Insufficient documentation

## 2013-10-29 DIAGNOSIS — Z8614 Personal history of Methicillin resistant Staphylococcus aureus infection: Secondary | ICD-10-CM | POA: Insufficient documentation

## 2013-10-29 MED ORDER — TRAMADOL HCL 50 MG PO TABS: 50.0000 mg | ORAL_TABLET | Freq: Two times a day (BID) | ORAL | Status: DC | PRN

## 2013-10-29 MED ORDER — ONDANSETRON HCL 4 MG PO TABS: 4.0000 mg | ORAL_TABLET | Freq: Four times a day (QID) | ORAL | Status: DC

## 2013-10-29 MED ORDER — METHOCARBAMOL 500 MG PO TABS: 500.0000 mg | ORAL_TABLET | Freq: Two times a day (BID) | ORAL | Status: DC

## 2013-10-29 NOTE — ED Provider Notes (Signed)
CSN: [id number]     Arrival date & time 10/29/13  1228 History   First MD Initiated Contact with Patient 10/29/13 1247     Chief Complaint  Patient presents with  . Shoulder Pain     (Consider location/radiation/quality/duration/timing/severity/associated sxs/prior Treatment) The history is provided by the patient. No language interpreter was used.  Jared Flowers is a 38 year old male with past medical history of acid reflux, joint pain, back pain, erosive esophagitis, hyperlipidemia presenting to emergency department with right shoulder pain. Patient reported that the right shoulder pain has been ongoing for the past couple of days. Reported that the pain radiates down his right arm to his elbow described as a sharp shooting pain with intermittent tingling. Stated that he had arthroscopic surgery performed by Dr. Cleophas Dunker on 09/13/2013 and is scheduled for physical therapy to begin next week-reported that has appointment with Dr. Cleophas Dunker in approximately 2 weeks. Patient reported that he was prescribed oxycodone-ibuprofen with positive relief-reported that he ran out towards the end of January, reported that he was given more medications approximately 3 weeks ago that he ran out of. Reported no fall, heavy lifting, shoveling, loss sensation, traumatic injury. PCP Dr. Gerda Diss  Past Medical History  Diagnosis Date  . Acid reflux   . Joint pain   . Back pain   . Erosive esophagitis   . Asthma   . Hyperlipidemia   . MRSA (methicillin resistant Staphylococcus aureus)     admitted to hospital 2010   Past Surgical History  Procedure Laterality Date  . Knee surgery      right knee  . Shoulder surgery     Family History  Problem Relation Age of Onset  . Cancer    . Asthma    . Diabetes    . Kidney disease    . Hypertension Mother   . Cancer Father   . Hypertension Father   . Cancer Sister     breast  . Cancer Brother     lung   History  Substance Use Topics  . Smoking  status: Current Every Day Smoker -- 0.50 packs/day    Types: Cigarettes  . Smokeless tobacco: Current User    Types: Snuff  . Alcohol Use: No     Comment: occasional     Review of Systems  Constitutional: Negative for fever and chills.  Respiratory: Negative for chest tightness and shortness of breath.   Musculoskeletal: Positive for arthralgias (Right shoulder). Negative for neck pain.  All other systems reviewed and are negative.      Allergies  Hydrocodone-acetaminophen  Home Medications   Current Outpatient Rx  Name  Route  Sig  Dispense  Refill  . albuterol (PROVENTIL HFA;VENTOLIN HFA) 108 (90 BASE) MCG/ACT inhaler   Inhalation   Inhale 1-2 puffs into the lungs every 6 (six) hours as needed for wheezing or shortness of breath.   1 Inhaler   6   . ibuprofen (ADVIL,MOTRIN) 600 MG tablet   Oral   Take 600 mg by mouth every 6 (six) hours as needed for moderate pain.         . nicotine (NICODERM CQ - DOSED IN MG/24 HOURS) 21 mg/24hr patch   Transdermal   Place 21 mg onto the skin daily.          BP 151/87  Pulse 87  Temp(Src) 98.6 F (37 C) (Oral)  Resp 20  Ht 5\' 10"  (1.778 m)  Wt 230 lb (104.327 kg)  BMI 33.00  kg/m2  SpO2 100% Physical Exam  Nursing note and vitals reviewed. Constitutional: He is oriented to person, place, and time. He appears well-developed and well-nourished. No distress.  HENT:  Head: Normocephalic and atraumatic.  Mouth/Throat: Oropharynx is clear and moist. No oropharyngeal exudate.  Eyes: Conjunctivae and EOM are normal. Pupils are equal, round, and reactive to light. Right eye exhibits no discharge. Left eye exhibits no discharge.  Neck: Normal range of motion. Neck supple. No tracheal deviation present.  Negative neck stiffness Negative nuchal rigidity Negative pain upon palpation to the C-spine  Cardiovascular: Normal rate, regular rhythm and normal heart sounds.  Exam reveals no friction rub.   No murmur heard. Pulses:       Radial pulses are 2+ on the right side, and 2+ on the left side.  Pulmonary/Chest: Effort normal and breath sounds normal. No respiratory distress. He has no wheezes. He has no rales.  Musculoskeletal: Normal range of motion.       Arms: Negative swelling, erythema, inflammation, lesions, sores, sunken in appearance, deformities noted to the right shoulder. Healing scar noted to the anterior aspect of right shoulder arthroscopic surgery was performed. Discomfort upon palpation to the anterior posterior aspect of right shoulder-near glenohumeral joint. Full range of motion to the right shoulder without difficulty or ataxia noted - full flexion, extension, inversion, eversion, adduction, abduction noted. Negative drop arm.  Lymphadenopathy:    He has no cervical adenopathy.  Neurological: He is alert and oriented to person, place, and time. He exhibits normal muscle tone. Coordination normal.  Cranial nerves III-XII grossly intact Strength 5+/5+ to upper and lower extremities bilaterally with resistance applied, equal distribution noted Equal grip strength Sensation intact with differentiation to sharp and dull touch to upper extremities bilaterally  Skin: Skin is warm and dry. He is not diaphoretic.  Psychiatric: He has a normal mood and affect. His behavior is normal. Thought content normal.    ED Course  Procedures (including critical care time)  Dg Shoulder Right  10/29/2013   CLINICAL DATA:  Right shoulder pain  EXAM: RIGHT SHOULDER - 2+ VIEW  COMPARISON:  02/05/2013  FINDINGS: There is no evidence of fracture or dislocation. There is no evidence of arthropathy or other focal bone abnormality. Soft tissues are unremarkable.  IMPRESSION: No acute osseous finding by plain radiography   Electronically Signed   By: Ruel Favorsrevor  Shick M.D.   On: 10/29/2013 14:20   Dg Humerus Right  10/29/2013   CLINICAL DATA:  Right shoulder and arm pain, recent shoulder surgery  EXAM: RIGHT HUMERUS - 2+ VIEW   COMPARISON:  None.  FINDINGS: There is no evidence of fracture or other focal bone lesions. Soft tissues are unremarkable.  IMPRESSION: No acute osseous finding   Electronically Signed   By: Ruel Favorsrevor  Shick M.D.   On: 10/29/2013 14:22   Labs Review Labs Reviewed - No data to display Imaging Review Dg Shoulder Right  10/29/2013   CLINICAL DATA:  Right shoulder pain  EXAM: RIGHT SHOULDER - 2+ VIEW  COMPARISON:  02/05/2013  FINDINGS: There is no evidence of fracture or dislocation. There is no evidence of arthropathy or other focal bone abnormality. Soft tissues are unremarkable.  IMPRESSION: No acute osseous finding by plain radiography   Electronically Signed   By: Ruel Favorsrevor  Shick M.D.   On: 10/29/2013 14:20   Dg Humerus Right  10/29/2013   CLINICAL DATA:  Right shoulder and arm pain, recent shoulder surgery  EXAM: RIGHT HUMERUS - 2+ VIEW  COMPARISON:  None.  FINDINGS: There is no evidence of fracture or other focal bone lesions. Soft tissues are unremarkable.  IMPRESSION: No acute osseous finding   Electronically Signed   By: Ruel Favors M.D.   On: 10/29/2013 14:22    EKG Interpretation   None       MDM   Final diagnoses:  Right shoulder pain   Filed Vitals:   10/29/13 1245  BP: 151/87  Pulse: 87  Temp: 98.6 F (37 C)  TempSrc: Oral  Resp: 20  Height: 5\' 10"  (1.778 m)  Weight: 230 lb (104.327 kg)  SpO2: 100%   Patient presenting to emergency department with right shoulder pain does been ongoing for the past couple of days. Reported that he had surgery performed, arthroscopic surgery performed, on 09/13/2013 by Dr. Matthias Hughs that he is to start physical therapy next week. Reported that he is due to have a visit with Dr. Dwain Sarna at approximately 2 weeks. Reported that he ran out of pain medications the other day. This provider reviewed patient's chart. Patient was seen and assessed by primary care provider on 10/07/2013 where he was requesting medications to be  administered-allegedly patient had already picked up pain medications and was asking for more pain medications to be prescribed - PCP was concerned patient was malingering. When asked if patient received pain medications he reported he did not-reported that his primary care provider did not prescribe pain medication secondary to his orthopedic prescribing pain medications. Alert and oriented. GCS 15. Heart rate and rhythm normal. Lungs clear to auscultation. Radial pulses 2+ bilaterally. Cap refill less than 3 seconds. Negative deformities noted to the right shoulder. Healing scar with arthroscopic surgery has been performed has been identified and noted. Discomfort upon palpation to anterior posterior aspect of the glenohumeral joint. Full range of motion noted to the right shoulder without difficulty or ataxia. Strength intact with equal distribution when resistance applied. Equal grip strength. Sensation intact with differentiation sharp and dull touch. Plain films of right shoulder and right humerus negative for acute osseous injury. Patient stable, afebrile. Negative focal neurological deficits noted. No new injury noted. Discharged patient. Referred patient to primary care provider and orthopedic-recommended patient to have prescription called in from orthopedics. Discharged patient with small dose of pain medications reported that he has taken Tramadol before causes nausea, this provider stated that the prescription can be changed, patient reported that he will keep the prescription to just prescribe him nausea medications - stated that he can take Tramadol - discussed course, precautions, disposal technique of the pain medications. Discussed with patient to rest and stay hydrated. Discussed with to avoid any physical or strenuous activity. Discussed with patient to closely monitor symptoms and if symptoms are to worsen or change to report back to the ED - strict return instructions given.  Patient agreed  to plan of care, understood, all questions answered.     Raymon Mutton, PA-C 10/30/13 [phone]

## 2013-10-29 NOTE — ED Notes (Addendum)
Pain rt shoulder . Had surgery [facility]/5 [facility] surgical [facility].  Has had pain in shoulder for 1 week.  Alert, talking   Denies injury.

## 2013-10-29 NOTE — Discharge Instructions (Signed)
Please call your doctor for a followup appointment within 24-48 hours. When you talk to your doctor please let them know that you were seen in the emergency department and have them acquire all of your records so that they can discuss the findings with you and formulate a treatment plan to fully care for your new and ongoing problems. Please call and set-up an appointment with Dr. Cleophas DunkerWhitfield regarding ongoing pain to the right shoulder Please rest and stay hydrated Please take medications as prescribed Please do not mix medications for this can lead to medical issues Please avoid any physical or strenuous activity Please continue to monitor symptoms closely and if symptoms are to worsen or change (fever greater than 101, chills, chest pain shortness of breath, difficulty breathing, fall, injury, numbness, tingling, worsening or changes to pain in the weakness) please report back to emergency department immediately   Arthralgia Arthralgia is joint pain. A joint is a place where two bones meet. Joint pain can happen for many reasons. The joint can be bruised, stiff, infected, or weak from aging. Pain usually goes away after resting and taking medicine for soreness.  HOME CARE  Rest the joint as told by your doctor.  Keep the sore joint raised (elevated) for the first 24 hours.  Put ice on the joint area.  Put ice in a plastic bag.  Place a towel between your skin and the bag.  Leave the ice on for 15-20 minutes, 03-04 times a day.  Wear your splint, casting, elastic bandage, or sling as told by your doctor.  Only take medicine as told by your doctor. Do not take aspirin.  Use crutches as told by your doctor. Do not put weight on the joint until told to by your doctor. GET HELP RIGHT AWAY IF:   You have bruising, puffiness (swelling), or more pain.  Your fingers or toes turn blue or start to lose feeling (numb).  Your medicine does not lessen the pain.  Your pain becomes  severe.  You have a temperature by mouth above 102 F (38.9 C), not controlled by medicine.  You cannot move or use the joint. MAKE SURE YOU:   Understand these instructions.  Will watch your condition.  Will get help right away if you are not doing well or get worse. Document Released: 08/14/2009 Document Revised: 11/18/2011 Document Reviewed: 08/14/2009 [facility] LP[facility] Patient Information 2014 [employer], [place].

## 2013-10-30 NOTE — ED Provider Notes (Signed)
Medical screening examination/treatment/procedure(s) were performed by non-physician practitioner and as supervising physician I was immediately available for consultation/collaboration.  EKG Interpretation   None        Shon Batonourtney F Marialice Newkirk, MD 10/30/13 [phone]

## 2013-11-02 ENCOUNTER — Ambulatory Visit (HOSPITAL_COMMUNITY)
Admission: RE | Admit: 2013-11-02 | Discharge: 2013-11-02 | Disposition: A | Discharge status: Home / Self Care | Payer: Medicaid Other | Source: Ambulatory Visit | Attending: Orthopaedic Surgery | Admitting: Orthopaedic Surgery

## 2013-11-02 DIAGNOSIS — S43439A Superior glenoid labrum lesion of unspecified shoulder, initial encounter: Secondary | ICD-10-CM | POA: Insufficient documentation

## 2013-11-02 DIAGNOSIS — S43499A Other sprain of unspecified shoulder joint, initial encounter: Secondary | ICD-10-CM | POA: Insufficient documentation

## 2013-11-02 DIAGNOSIS — M1710 Unilateral primary osteoarthritis, unspecified knee: Secondary | ICD-10-CM | POA: Insufficient documentation

## 2013-11-02 DIAGNOSIS — Z9889 Other specified postprocedural states: Secondary | ICD-10-CM | POA: Insufficient documentation

## 2013-11-02 DIAGNOSIS — S46819A Strain of other muscles, fascia and tendons at shoulder and upper arm level, unspecified arm, initial encounter: Secondary | ICD-10-CM

## 2013-11-02 DIAGNOSIS — X58XXXA Exposure to other specified factors, initial encounter: Secondary | ICD-10-CM | POA: Insufficient documentation

## 2013-11-02 DIAGNOSIS — M6281 Muscle weakness (generalized): Secondary | ICD-10-CM

## 2013-11-02 DIAGNOSIS — J45909 Unspecified asthma, uncomplicated: Secondary | ICD-10-CM | POA: Insufficient documentation

## 2013-11-02 DIAGNOSIS — K219 Gastro-esophageal reflux disease without esophagitis: Secondary | ICD-10-CM | POA: Insufficient documentation

## 2013-11-02 DIAGNOSIS — IMO0001 Reserved for inherently not codable concepts without codable children: Secondary | ICD-10-CM | POA: Insufficient documentation

## 2013-11-02 DIAGNOSIS — G8929 Other chronic pain: Secondary | ICD-10-CM | POA: Insufficient documentation

## 2013-11-02 DIAGNOSIS — M25519 Pain in unspecified shoulder: Secondary | ICD-10-CM | POA: Insufficient documentation
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
## 2013-11-09 ENCOUNTER — Inpatient Hospital Stay (HOSPITAL_COMMUNITY): Admission: RE | Admit: 2013-11-09 | Payer: Medicaid Other | Source: Ambulatory Visit | Admitting: Specialist

## 2013-11-16 ENCOUNTER — Emergency Department (HOSPITAL_COMMUNITY)
Admission: EM | Admit: 2013-11-16 | Discharge: 2013-11-16 | Disposition: A | Discharge status: Home / Self Care | Payer: Medicaid Other | Attending: Emergency Medicine | Admitting: Emergency Medicine

## 2013-11-16 ENCOUNTER — Emergency Department (HOSPITAL_COMMUNITY): Payer: Medicaid Other

## 2013-11-16 ENCOUNTER — Encounter (HOSPITAL_COMMUNITY): Payer: Self-pay | Admitting: Emergency Medicine

## 2013-11-16 ENCOUNTER — Inpatient Hospital Stay (HOSPITAL_COMMUNITY): Admission: RE | Admit: 2013-11-16 | Payer: Medicaid Other | Source: Ambulatory Visit

## 2013-11-16 DIAGNOSIS — E785 Hyperlipidemia, unspecified: Secondary | ICD-10-CM | POA: Insufficient documentation

## 2013-11-16 DIAGNOSIS — J45909 Unspecified asthma, uncomplicated: Secondary | ICD-10-CM | POA: Insufficient documentation

## 2013-11-16 DIAGNOSIS — Z8614 Personal history of Methicillin resistant Staphylococcus aureus infection: Secondary | ICD-10-CM | POA: Insufficient documentation

## 2013-11-16 DIAGNOSIS — Z79899 Other long term (current) drug therapy: Secondary | ICD-10-CM | POA: Insufficient documentation

## 2013-11-16 DIAGNOSIS — S46911A Strain of unspecified muscle, fascia and tendon at shoulder and upper arm level, right arm, initial encounter: Secondary | ICD-10-CM

## 2013-11-16 DIAGNOSIS — Y9389 Activity, other specified: Secondary | ICD-10-CM | POA: Insufficient documentation

## 2013-11-16 DIAGNOSIS — Z9889 Other specified postprocedural states: Secondary | ICD-10-CM | POA: Insufficient documentation

## 2013-11-16 DIAGNOSIS — IMO0002 Reserved for concepts with insufficient information to code with codable children: Secondary | ICD-10-CM | POA: Insufficient documentation

## 2013-11-16 DIAGNOSIS — Z8719 Personal history of other diseases of the digestive system: Secondary | ICD-10-CM | POA: Insufficient documentation

## 2013-11-16 DIAGNOSIS — Y929 Unspecified place or not applicable: Secondary | ICD-10-CM | POA: Insufficient documentation

## 2013-11-16 DIAGNOSIS — X500XXA Overexertion from strenuous movement or load, initial encounter: Secondary | ICD-10-CM | POA: Insufficient documentation

## 2013-11-16 DIAGNOSIS — F17200 Nicotine dependence, unspecified, uncomplicated: Secondary | ICD-10-CM | POA: Insufficient documentation

## 2013-11-16 MED ORDER — OXYCODONE-ACETAMINOPHEN 5-325 MG PO TABS: 1.0000 | ORAL_TABLET | ORAL | Status: DC | PRN

## 2013-11-16 NOTE — ED Notes (Addendum)
Pt was moving a twin mattress when he felt something pull and pop in his right shoulder, is concerned because he had surgery on right shoulder on 09/13/2013, cms intact distal. Pt has sling on right shoulder upon arrival to triage,

## 2013-11-16 NOTE — Discharge Instructions (Signed)
Joint Sprain A sprain is a tear or stretch in the ligaments that hold a joint together. Severe sprains may need as long as 3-6 weeks of immobilization and/or exercises to heal completely. Sprained joints should be rested and protected. If not, they can become unstable and prone to re-injury. Proper treatment can reduce your pain, shorten the period of disability, and reduce the risk of repeated injuries. TREATMENT   Rest and elevate the injured joint to reduce pain and swelling.  Apply ice packs to the injury for 20-30 minutes every 2-3 hours for the next 2-3 days.  Keep the injury wrapped in a compression bandage or splint as long as the joint is painful or as instructed by your caregiver.  Do not use the injured joint until it is completely healed to prevent re-injury and chronic instability. Follow the instructions of your caregiver.  Long-term sprain management may require exercises and/or treatment by a physical therapist. Taping or special braces may help stabilize the joint until it is completely better. SEEK MEDICAL CARE IF:   You develop increased pain or swelling of the joint.  You develop increasing redness and warmth of the joint.  You develop a fever.  It becomes stiff.  Your hand or foot gets cold or numb. Document Released: 10/03/2004 Document Revised: 11/18/2011 Document Reviewed: 09/12/2008 [facility] Patient Information 2014 [employer] [place], [place].   You may use the medicine prescribed for your pain. Use ice per above.  Keep your appointment with physical therapy tomorrow.  Also,  Dr Cleophas DunkerWhitfield should prescribe any further narcotic medicines if he deems warranted.  Continue taking your ibuprofen.  Your shoulder xray today is unchanged.

## 2013-11-18 NOTE — ED Provider Notes (Signed)
CSN: [id number]     Arrival date & time 11/16/13  1545 History   First MD Initiated Contact with Patient 11/16/13 1605     Chief Complaint  Patient presents with  . Shoulder Injury     (Consider location/radiation/quality/duration/timing/severity/associated sxs/prior Treatment) HPI Comments: Jared Flowers is a 38 y.o. Male presenting with increased pain in his right shoulder today when he felt a popping sensation while moving a twin mattress in his child's room this morning.  He has had a shoulder surgery by Dr. Cleophas DunkerWhitfield 2 months ago for an anterior labrum tear and is concerned he may have caused injury to this repair.  He has not contacted his surgeon but plans to do so today.  He denies numbness or weakness in the right arm or hand. He has applied ice, has taken ibuprofen 600 mg  and presents wearing his sling, stating only the sling gives any relief.     The history is provided by the patient.    Past Medical History  Diagnosis Date  . Acid reflux   . Joint pain   . Back pain   . Erosive esophagitis   . Asthma   . Hyperlipidemia   . MRSA (methicillin resistant Staphylococcus aureus)     admitted to hospital 2010   Past Surgical History  Procedure Laterality Date  . Knee surgery      right knee  . Shoulder surgery     Family History  Problem Relation Age of Onset  . Cancer    . Asthma    . Diabetes    . Kidney disease    . Hypertension Mother   . Cancer Father   . Hypertension Father   . Cancer Sister     breast  . Cancer Brother     lung   History  Substance Use Topics  . Smoking status: Current Every Day Smoker -- 0.50 packs/day    Types: Cigarettes  . Smokeless tobacco: Current User    Types: Snuff  . Alcohol Use: No     Comment: occasional     Review of Systems  Constitutional: Negative for fever.  Musculoskeletal: Positive for arthralgias. Negative for joint swelling and myalgias.  Neurological: Negative for weakness and numbness.       Allergies  Hydrocodone-acetaminophen and Tramadol  Home Medications   Current Outpatient Rx  Name  Route  Sig  Dispense  Refill  . albuterol (PROVENTIL HFA;VENTOLIN HFA) 108 (90 BASE) MCG/ACT inhaler   Inhalation   Inhale 1-2 puffs into the lungs every 6 (six) hours as needed for wheezing or shortness of breath.   1 Inhaler   6   . gemfibrozil (LOPID) 600 MG tablet   Oral   Take 600 mg by mouth 2 (two) times daily before a meal.         . ibuprofen (ADVIL,MOTRIN) 600 MG tablet   Oral   Take 600 mg by mouth every 6 (six) hours as needed for moderate pain.         . nicotine (NICODERM CQ - DOSED IN MG/24 HOURS) 21 mg/24hr patch   Transdermal   Place 21 mg onto the skin daily.         Marland Kitchen. oxyCODONE-acetaminophen (PERCOCET/ROXICET) 5-325 MG per tablet   Oral   Take 1 tablet by mouth every 4 (four) hours as needed for severe pain.   5 tablet   0    BP 142/83  Pulse 82  Temp(Src) 98.1 F (36.7  C) (Oral)  Resp 18  Ht 5\' 11"  (1.803 m)  Wt 230 lb (104.327 kg)  BMI 32.09 kg/m2  SpO2 95% Physical Exam  Constitutional: He appears well-developed and well-nourished.  HENT:  Head: Atraumatic.  Neck: Normal range of motion.  Cardiovascular:  Pulses equal bilaterally  Musculoskeletal:       Right shoulder: He exhibits bony tenderness and pain. He exhibits no swelling, no effusion, no crepitus, no deformity, no spasm and normal pulse.  TTP along anterior shoulder joint, no edema, no deformity.  Well healed arthroscopic incision sites. Increased pain with passive and active flex/ext.  No crepitus appreciated.  Neurological: He is alert. He has normal strength. He displays normal reflexes. No sensory deficit.  Skin: Skin is warm and dry.  Psychiatric: He has a normal mood and affect.    ED Course  Procedures (including critical care time) Labs Review Labs Reviewed - No data to display Imaging Review Dg Shoulder Right  11/16/2013   CLINICAL DATA Shoulder pain   EXAM RIGHT SHOULDER - 2+ VIEW  COMPARISON None.  FINDINGS Mild degenerative changes of the acromioclavicular joint are again seen. No fracture or dislocation is seen. The underlying bony thorax is unremarkable.  IMPRESSION No acute abnormality noted.  SIGNATURE  Electronically Signed   By: Alcide Clever M.D.   On: 11/16/2013 16:34     EKG Interpretation None      MDM   Final diagnoses:  Right shoulder strain    pts chart reviewed, he has had multiple visits for orthopedic pain issues, mainly right shoulder and right knee.  He was advised he needs his orthopedic surgeon to be involved in his care and treatment.  Pt understands.  He was given a prescription for #5 oxycodone,  Tried ibuprofen without relief, claims allergy to tramadol.  Pt advised further pain management should come from his primary/ortho providers.  Pt understands and will contact the surgeon today.    Burgess Amor, PA-C 11/18/13 1448

## 2013-11-21 NOTE — ED Provider Notes (Signed)
Medical screening examination/treatment/procedure(s) were performed by non-physician practitioner and as supervising physician I was immediately available for consultation/collaboration.   EKG Interpretation None       Aella Ronda, MD 11/21/13 1938 

## 2013-11-23 ENCOUNTER — Inpatient Hospital Stay (HOSPITAL_COMMUNITY): Admission: RE | Admit: 2013-11-23 | Payer: Medicaid Other | Source: Ambulatory Visit

## 2013-12-31 ENCOUNTER — Telehealth: Payer: Self-pay | Admitting: *Deleted

## 2013-12-31 ENCOUNTER — Ambulatory Visit: Payer: Medicaid Other | Admitting: Family Medicine

## 2013-12-31 ENCOUNTER — Telehealth: Payer: Self-pay | Admitting: Family Medicine

## 2013-12-31 MED ORDER — GEMFIBROZIL 600 MG PO TABS: 600.0000 mg | ORAL_TABLET | Freq: Two times a day (BID) | ORAL | Status: DC

## 2013-12-31 MED ORDER — IBUPROFEN 600 MG PO TABS: 600.0000 mg | ORAL_TABLET | Freq: Four times a day (QID) | ORAL | Status: DC | PRN

## 2013-12-31 NOTE — Telephone Encounter (Signed)
Discussed with patient

## 2013-12-31 NOTE — Telephone Encounter (Signed)
Pt needs ibuprofen and cholesterol med refilled.

## 2013-12-31 NOTE — Telephone Encounter (Signed)
meds sent to pharm

## 2013-12-31 NOTE — Telephone Encounter (Signed)
I will be happy to initiate the referral thank you. Please let patient know and has been initiated it can often take a couple weeks to hear back.

## 2013-12-31 NOTE — Telephone Encounter (Signed)
Patient is requesting a referral to a pain clinic.

## 2013-12-31 NOTE — Telephone Encounter (Signed)
May refill ibuprofen and gemfibrozil with this +2 additional refills. No refill for oxycodone. The patient knows that we will not be prescribing pain medicine. Patient will followup within the next 60 days for standard office visit

## 2014-01-03 ENCOUNTER — Other Ambulatory Visit: Payer: Self-pay | Admitting: Family Medicine

## 2014-01-03 DIAGNOSIS — M25519 Pain in unspecified shoulder: Secondary | ICD-10-CM

## 2014-01-03 NOTE — Telephone Encounter (Signed)
Referral put in.

## 2014-01-05 ENCOUNTER — Telehealth: Payer: Self-pay | Admitting: Family Medicine

## 2014-01-05 ENCOUNTER — Ambulatory Visit (INDEPENDENT_AMBULATORY_CARE_PROVIDER_SITE_OTHER): Payer: Medicaid Other | Admitting: Family Medicine

## 2014-01-05 ENCOUNTER — Other Ambulatory Visit (HOSPITAL_COMMUNITY): Payer: Self-pay | Admitting: Orthopaedic Surgery

## 2014-01-05 ENCOUNTER — Encounter: Payer: Self-pay | Admitting: Family Medicine

## 2014-01-05 VITALS — BP 142/80 | Ht 71.0 in | Wt 219.0 lb

## 2014-01-05 DIAGNOSIS — M25511 Pain in right shoulder: Secondary | ICD-10-CM

## 2014-01-05 DIAGNOSIS — M25519 Pain in unspecified shoulder: Secondary | ICD-10-CM

## 2014-01-05 NOTE — Progress Notes (Signed)
   Subjective:    Patient ID: Jared Flowers, male    DOB: 09-Mar-1976, 38 y.o.   MRN: [id number]  HPIRight shoulder pain. [facility]. Had surgery this year on shoulder. Shoulder popped while putting together a bed.   Patient feels that he needs to have surgical care. He states he has difficult time getting up with orthopedist.  Review of Systems He relates shoulder pain denies neck pain denies chest pain shortness breath nausea vomiting fever coughing    Objective:   Physical Exam  Subjective shoulder pain right side with decreased range of motion lungs clear heart regular     Assessment & Plan:  This gentleman has a shoulder pain discomfort on the right side. There is really nothing I can offer him other and anti-inflammatories. For reasons previously stated another notes I will not be prescribing narcotic pain medicines to this patient. He will be following up with orthopedics.  With interesting turn of events it was noted later in the afternoon that this patient was being treated by orthopedics. When I saw patient earlier in the day he did not mention he was seeing orthopedics. So therefore we canceled the MRI orthopedics handle the issue

## 2014-01-05 NOTE — Telephone Encounter (Signed)
FYI - Dr. Hoy RegisterWhitfield's office called to say that pt's being seen there today and that Dr. Cleophas DunkerWhitfield would like a special MRI ordered to eval pt's shoulder, we cancelled our order for MRI and Dr. Cleophas DunkerWhitfield will be ordering and following up with pt on results

## 2014-01-13 ENCOUNTER — Ambulatory Visit (HOSPITAL_COMMUNITY)
Admission: RE | Admit: 2014-01-13 | Discharge: 2014-01-13 | Disposition: A | Discharge status: Home / Self Care | Payer: Medicaid Other | Source: Ambulatory Visit | Attending: Orthopaedic Surgery | Admitting: Orthopaedic Surgery

## 2014-01-13 ENCOUNTER — Other Ambulatory Visit (HOSPITAL_COMMUNITY): Payer: Medicaid Other

## 2014-01-13 ENCOUNTER — Other Ambulatory Visit (HOSPITAL_COMMUNITY): Payer: Self-pay | Admitting: Orthopaedic Surgery

## 2014-01-13 DIAGNOSIS — M25511 Pain in right shoulder: Secondary | ICD-10-CM

## 2014-01-13 DIAGNOSIS — M25519 Pain in unspecified shoulder: Secondary | ICD-10-CM | POA: Insufficient documentation

## 2014-01-13 MED ORDER — LIDOCAINE HCL (PF) 1 % IJ SOLN
INTRAMUSCULAR | Status: AC
  Administered 2014-01-13: 11:00:00 10 mL
  Filled 2014-01-13: qty 5

## 2014-01-13 MED ORDER — LIDOCAINE HCL (PF) 1 % IJ SOLN
10.0000 mL | Freq: Once | INTRAMUSCULAR | Status: AC
  Administered 2014-01-13: 10 mL

## 2014-01-13 MED ORDER — POVIDONE-IODINE 10 % EX SOLN
CUTANEOUS | Status: DC | PRN
  Administered 2014-01-13: 11:00:00 via TOPICAL

## 2014-01-13 MED ORDER — LIDOCAINE HCL (PF) 1 % IJ SOLN
INTRAMUSCULAR | Status: AC
  Filled 2014-01-13: qty 5

## 2014-01-13 MED ORDER — IOHEXOL 300 MG/ML  SOLN
50.0000 mL | Freq: Once | INTRAMUSCULAR | Status: AC | PRN
  Administered 2014-01-13: 20 mL via INTRAVENOUS

## 2014-01-13 MED ORDER — GADOBENATE DIMEGLUMINE 529 MG/ML IV SOLN
5.0000 mL | Freq: Once | INTRAVENOUS | Status: AC | PRN
  Administered 2014-01-13: 1 mL via INTRAVENOUS

## 2014-01-13 NOTE — Procedures (Signed)
CLINICAL DATA: [Right shoulder pain. Pre MRI.] EXAM:  [RIGHT SHOULDER INJECTION UNDER FLUOROSCOPY TECHNIQUE:  An appropriate skin entrance site was determined. The site was marked, prepped with Betadine, draped in the usual sterile fashion, and infiltrated locally with buffered Lidocaine. 22 gauge spinal needle was advanced to the superomedial margin of the humeral head under intermittent fluoroscopy. A mixture of 0.05 ml Multihance, 5 cc Omnipaque 300, 5 cc 1% lidocaine was injected without difficulty. No immediate complication. FLUOROSCOPY TIME:  [1 min and 18 seconds] IMPRESSION: Technically successful [right] shoulder injection for MRI.

## 2014-01-26 NOTE — Progress Notes (Signed)
  Patient Details  Name: Jared Flowers MRN: [id number] Date of Birth: August 04, 1976  Today's Date: 01/26/2014  Patient has been discharged from therapy due to a lack of attendance since 11/02/13.   [name] [name], OTR/L,CBIS   01/26/2014, 3:18 PM

## 2014-01-26 NOTE — Addendum Note (Signed)
Encounter addended by: Jeanene ErbLaura D Essenmacher, OT on: 01/26/2014  3:19 PM<BR>     Documentation filed: Episodes, Notes Section, Letters

## 2014-02-04 ENCOUNTER — Ambulatory Visit (INDEPENDENT_AMBULATORY_CARE_PROVIDER_SITE_OTHER): Payer: Medicaid Other | Admitting: Family Medicine

## 2014-02-04 ENCOUNTER — Encounter: Payer: Self-pay | Admitting: Family Medicine

## 2014-02-04 VITALS — BP 134/90 | Temp 98.3°F | Ht 71.0 in | Wt 217.0 lb

## 2014-02-04 DIAGNOSIS — J45909 Unspecified asthma, uncomplicated: Secondary | ICD-10-CM

## 2014-02-04 DIAGNOSIS — J683 Other acute and subacute respiratory conditions due to chemicals, gases, fumes and vapors: Secondary | ICD-10-CM

## 2014-02-04 DIAGNOSIS — J329 Chronic sinusitis, unspecified: Secondary | ICD-10-CM

## 2014-02-04 MED ORDER — DOXYCYCLINE HYCLATE 100 MG PO TABS: 100.0000 mg | ORAL_TABLET | Freq: Two times a day (BID) | ORAL | Status: DC

## 2014-02-04 MED ORDER — PREDNISONE 20 MG PO TABS: ORAL_TABLET | ORAL | Status: AC

## 2014-02-04 NOTE — Progress Notes (Signed)
   Subjective:    Patient ID: Jared Flowers, male    DOB: 04-Nov-1975, 38 y.o.   MRN: [id number]  Cough This is a new problem. The current episode started 1 to 4 weeks ago. Associated symptoms include chest pain, headaches, myalgias, nasal congestion and wheezing. Treatments tried: tylenol sinus, herbal antibiotic, inhaler.    Achey, coughing and productive of phlegm  Diminished energy  Has cut down smoking    Review of Systems  Respiratory: Positive for cough and wheezing.   Cardiovascular: Positive for chest pain.  Musculoskeletal: Positive for myalgias.  Neurological: Positive for headaches.       Objective:   Physical Exam Alert moderate malaise. H&T slight nasal congestion pharynx normal lungs bilateral wheezes bronchial cough during exam heart regular in rhythm.       Assessment & Plan:  Impression rhinosinusitis/bronchitis with exacerbation of reactive airways plan antibiotics prescribed. Symptomatic care discussed. Prednisone taper. Albuterol when necessary. Rationale discussed.

## 2014-02-08 ENCOUNTER — Telehealth: Payer: Self-pay | Admitting: Family Medicine

## 2014-02-08 NOTE — Telephone Encounter (Signed)
Patient says that the pain management clinic that he was sent to sent him a denial letter that they couldn't take him, he says he thinks because he has Medicaid. He wants to know what the next step is, Can we get him an urgent referral somewhere else or refill his medication?

## 2014-02-08 NOTE — Telephone Encounter (Signed)
Do to pass discrepancies in his story in regards to pain medication I have informed the patient on a previous visit that I will not be prescribing additional pain medicine. We have tried to refer him to a pain center. So far there are none willing to see him. We will be sending him a updated letter letting him know that our office will not be prescribing pain medicine.

## 2014-02-08 NOTE — Telephone Encounter (Signed)
Patient called about pain medication and denies another referral to specialist at this time.

## 2014-07-29 ENCOUNTER — Encounter (HOSPITAL_COMMUNITY): Payer: Self-pay | Admitting: *Deleted

## 2014-07-29 ENCOUNTER — Emergency Department (HOSPITAL_COMMUNITY)
Admission: EM | Admit: 2014-07-29 | Discharge: 2014-07-29 | Disposition: A | Discharge status: Home / Self Care | Payer: Medicaid Other | Attending: Emergency Medicine | Admitting: Emergency Medicine

## 2014-07-29 DIAGNOSIS — Z792 Long term (current) use of antibiotics: Secondary | ICD-10-CM | POA: Diagnosis not present

## 2014-07-29 DIAGNOSIS — Z8614 Personal history of Methicillin resistant Staphylococcus aureus infection: Secondary | ICD-10-CM | POA: Insufficient documentation

## 2014-07-29 DIAGNOSIS — M25511 Pain in right shoulder: Secondary | ICD-10-CM | POA: Diagnosis not present

## 2014-07-29 DIAGNOSIS — Z79899 Other long term (current) drug therapy: Secondary | ICD-10-CM | POA: Insufficient documentation

## 2014-07-29 DIAGNOSIS — E785 Hyperlipidemia, unspecified: Secondary | ICD-10-CM | POA: Diagnosis not present

## 2014-07-29 DIAGNOSIS — J45909 Unspecified asthma, uncomplicated: Secondary | ICD-10-CM | POA: Insufficient documentation

## 2014-07-29 DIAGNOSIS — Z8719 Personal history of other diseases of the digestive system: Secondary | ICD-10-CM | POA: Insufficient documentation

## 2014-07-29 MED ORDER — NAPROXEN 500 MG PO TABS: 500.0000 mg | ORAL_TABLET | Freq: Two times a day (BID) | ORAL | Status: DC

## 2014-07-29 MED ORDER — CYCLOBENZAPRINE HCL 10 MG PO TABS: 10.0000 mg | ORAL_TABLET | Freq: Two times a day (BID) | ORAL | Status: DC | PRN

## 2014-07-29 NOTE — Discharge Instructions (Signed)
It is important that you follow up with Dr. Cleophas DunkerWhitfield for your shoulder pain. Call the work in clinic that that they have on Saturday. Wear the sling for comfort and apply ice. Do not take the muscle relaxant if driving as it will make you sleepy.

## 2014-07-29 NOTE — ED Provider Notes (Signed)
CSN: [id number]     Arrival date & time 07/29/14  1616 History   First MD Initiated Contact with Patient 07/29/14 1633     Chief Complaint  Patient presents with  . Shoulder Pain     (Consider location/radiation/quality/duration/timing/severity/associated sxs/prior Treatment) Patient is a 38 y.o. male presenting with shoulder pain. The history is provided by the patient.  Shoulder Pain Location:  Shoulder Injury: no   Shoulder location:  R shoulder Pain details:    Quality:  Sharp   Radiates to:  Does not radiate   Severity:  Severe   Timing:  Constant Handedness:  Right-handed Prior injury to area:  Yes Worsened by:  Movement  Jared Flowers is a 38 y.o. male who presents to the ED with right shoulder pain that has been getting worse over the past 2 days. He denies any injury. He has had surgery on the right shoulder for torn ligaments in of the rotator cuff in March 2015. He has had some pain off and on since the surgery. He called his PCP before he came here and told his next appointment was Jan. Patient is going to try and see his orthopedic doctor. Patient had to leave work today due to the pain. Patient states that as long as the arm is still the pain is very little but when he picks up something or puts his arm over his head the pain is severe.   Past Medical History  Diagnosis Date  . Acid reflux   . Joint pain   . Back pain   . Erosive esophagitis   . Asthma   . Hyperlipidemia   . MRSA (methicillin resistant Staphylococcus aureus)     admitted to hospital 2010   Past Surgical History  Procedure Laterality Date  . Knee surgery      right knee  . Shoulder surgery     Family History  Problem Relation Age of Onset  . Cancer    . Asthma    . Diabetes    . Kidney disease    . Hypertension Mother   . Cancer Father   . Hypertension Father   . Cancer Sister     breast  . Cancer Brother     lung   History  Substance Use Topics  . Smoking status: Current  Every Day Smoker -- 0.50 packs/day    Types: Cigarettes  . Smokeless tobacco: Current User    Types: Snuff  . Alcohol Use: No     Comment: occasional     Review of Systems Negative except as stated in HPI   Allergies  Hydrocodone-acetaminophen and Tramadol  Home Medications   Prior to Admission medications   Medication Sig Start Date End Date Taking? Authorizing Provider  albuterol (PROVENTIL HFA;VENTOLIN HFA) 108 (90 BASE) MCG/ACT inhaler Inhale 1-2 puffs into the lungs every 6 (six) hours as needed for wheezing or shortness of breath. 10/01/13   Babs SciaraScott A Luking, MD  doxycycline (VIBRA-TABS) 100 MG tablet Take 1 tablet (100 mg total) by mouth 2 (two) times daily. 02/04/14   Merlyn AlbertWilliam S Luking, MD  gemfibrozil (LOPID) 600 MG tablet Take 1 tablet (600 mg total) by mouth 2 (two) times daily before a meal. 12/31/13   Babs SciaraScott A Luking, MD  ibuprofen (ADVIL,MOTRIN) 600 MG tablet Take 1 tablet (600 mg total) by mouth every 6 (six) hours as needed for moderate pain. 12/31/13   Babs SciaraScott A Luking, MD  Omega-3 Fatty Acids (FISH OIL PO) Take by  mouth. Take 2 every day    Historical Provider, MD   BP 141/98 mmHg  Pulse 89  Temp(Src) 98.6 F (37 C) (Oral)  Resp 20  Ht 5\' 11"  (1.803 m)  Wt 225 lb (102.059 kg)  BMI 31.39 kg/m2  SpO2 98% Physical Exam  Constitutional: He is oriented to person, place, and time. He appears well-developed and well-nourished.  HENT:  Head: Normocephalic.  Eyes: EOM are normal.  Neck: Neck supple.  Cardiovascular: Normal rate.   Pulmonary/Chest: Effort normal.  Musculoskeletal:       Right shoulder: He exhibits decreased range of motion, tenderness and pain. He exhibits no swelling, no crepitus, no deformity, normal pulse and normal strength.  Pain with palpation anterior aspect of the right shoulder.   Neurological: He is alert and oriented to person, place, and time. No cranial nerve deficit.  Skin: Skin is warm and dry.  Psychiatric: He has a normal mood and affect.  His behavior is normal.  Nursing note and vitals reviewed.   ED Course  Procedures (including critical care time) Labs Review Sling, ice, rest  MDM  38 y.o. male with acute on chronic right shoulder pain. Stable for discharge without neurovascular deficits. He is to follow up with his orthopedic doctor. Will treat for pain. Discussed with the patient and all questioned fully answered.    Medication List    STOP taking these medications        ibuprofen 600 MG tablet  Commonly known as:  ADVIL,MOTRIN      TAKE these medications        cyclobenzaprine 10 MG tablet  Commonly known as:  FLEXERIL  Take 1 tablet (10 mg total) by mouth 2 (two) times daily as needed for muscle spasms.     naproxen 500 MG tablet  Commonly known as:  NAPROSYN  Take 1 tablet (500 mg total) by mouth 2 (two) times daily.      ASK your doctor about these medications        albuterol 108 (90 BASE) MCG/ACT inhaler  Commonly known as:  PROVENTIL HFA;VENTOLIN HFA  Inhale 1-2 puffs into the lungs every 6 (six) hours as needed for wheezing or shortness of breath.     doxycycline 100 MG tablet  Commonly known as:  VIBRA-TABS  Take 1 tablet (100 mg total) by mouth 2 (two) times daily.     FISH OIL PO  Take by mouth. Take 2 every day     gemfibrozil 600 MG tablet  Commonly known as:  LOPID  Take 1 tablet (600 mg total) by mouth 2 (two) times daily before a meal.            Janne NapoleonHope M Neese, NP 07/29/14 2306  Donnetta HutchingBrian Cook, MD 08/01/14 1259

## 2014-07-29 NOTE — ED Notes (Signed)
Pain rt shoulder for 2 days, No injury.  Had surgery on shoulder in March 2015 for torn ligaments

## 2014-10-13 ENCOUNTER — Emergency Department (HOSPITAL_COMMUNITY)
Admission: EM | Admit: 2014-10-13 | Discharge: 2014-10-14 | Disposition: A | Discharge status: Home / Self Care | Payer: BLUE CROSS/BLUE SHIELD | Attending: Emergency Medicine | Admitting: Emergency Medicine

## 2014-10-13 ENCOUNTER — Encounter (HOSPITAL_COMMUNITY): Payer: Self-pay | Admitting: Emergency Medicine

## 2014-10-13 DIAGNOSIS — Y9289 Other specified places as the place of occurrence of the external cause: Secondary | ICD-10-CM | POA: Insufficient documentation

## 2014-10-13 DIAGNOSIS — E785 Hyperlipidemia, unspecified: Secondary | ICD-10-CM | POA: Diagnosis not present

## 2014-10-13 DIAGNOSIS — M25521 Pain in right elbow: Secondary | ICD-10-CM

## 2014-10-13 DIAGNOSIS — S199XXA Unspecified injury of neck, initial encounter: Secondary | ICD-10-CM | POA: Diagnosis not present

## 2014-10-13 DIAGNOSIS — Z792 Long term (current) use of antibiotics: Secondary | ICD-10-CM | POA: Insufficient documentation

## 2014-10-13 DIAGNOSIS — M62838 Other muscle spasm: Secondary | ICD-10-CM

## 2014-10-13 DIAGNOSIS — Y998 Other external cause status: Secondary | ICD-10-CM | POA: Diagnosis not present

## 2014-10-13 DIAGNOSIS — S4990XA Unspecified injury of shoulder and upper arm, unspecified arm, initial encounter: Secondary | ICD-10-CM | POA: Diagnosis not present

## 2014-10-13 DIAGNOSIS — Z8614 Personal history of Methicillin resistant Staphylococcus aureus infection: Secondary | ICD-10-CM | POA: Diagnosis not present

## 2014-10-13 DIAGNOSIS — S59901A Unspecified injury of right elbow, initial encounter: Secondary | ICD-10-CM | POA: Insufficient documentation

## 2014-10-13 DIAGNOSIS — Z8719 Personal history of other diseases of the digestive system: Secondary | ICD-10-CM | POA: Diagnosis not present

## 2014-10-13 DIAGNOSIS — Z720 Tobacco use: Secondary | ICD-10-CM | POA: Diagnosis not present

## 2014-10-13 DIAGNOSIS — J45909 Unspecified asthma, uncomplicated: Secondary | ICD-10-CM | POA: Insufficient documentation

## 2014-10-13 DIAGNOSIS — Y9389 Activity, other specified: Secondary | ICD-10-CM | POA: Insufficient documentation

## 2014-10-13 DIAGNOSIS — Z791 Long term (current) use of non-steroidal anti-inflammatories (NSAID): Secondary | ICD-10-CM | POA: Diagnosis not present

## 2014-10-13 DIAGNOSIS — W108XXA Fall (on) (from) other stairs and steps, initial encounter: Secondary | ICD-10-CM | POA: Diagnosis not present

## 2014-10-13 DIAGNOSIS — W109XXA Fall (on) (from) unspecified stairs and steps, initial encounter: Secondary | ICD-10-CM

## 2014-10-13 DIAGNOSIS — M542 Cervicalgia: Secondary | ICD-10-CM

## 2014-10-13 DIAGNOSIS — Z79899 Other long term (current) drug therapy: Secondary | ICD-10-CM | POA: Insufficient documentation

## 2014-10-13 DIAGNOSIS — M25532 Pain in left wrist: Secondary | ICD-10-CM

## 2014-10-13 DIAGNOSIS — S6992XA Unspecified injury of left wrist, hand and finger(s), initial encounter: Secondary | ICD-10-CM | POA: Insufficient documentation

## 2014-10-13 MED ORDER — IBUPROFEN 400 MG PO TABS
800.0000 mg | ORAL_TABLET | Freq: Once | ORAL | Status: AC
  Administered 2014-10-13: 800 mg via ORAL
  Filled 2014-10-13: qty 2

## 2014-10-13 MED ORDER — METHOCARBAMOL 500 MG PO TABS
1000.0000 mg | ORAL_TABLET | Freq: Once | ORAL | Status: AC
  Administered 2014-10-13: 1000 mg via ORAL
  Filled 2014-10-13: qty 2

## 2014-10-13 NOTE — ED Notes (Signed)
Pt. slipped and fell backwards yesterday , no LOC / ambulatory , reports pain at left wrist , right elbow and upper back pain .

## 2014-10-13 NOTE — ED Provider Notes (Signed)
CSN: [id number]     Arrival date & time 10/13/14  2245 History  This chart was scribed for non-physician practitioner, Dierdre Forth, PA-C, working with Loren Racer, MD, by Bronson Curb, ED Scribe. This patient was seen in room TR08C/TR08C and the patient's care was started at 11:37 PM.   Chief Complaint  Patient presents with  . Fall    The history is provided by the patient and medical records. No language interpreter was used.     HPI Comments: Jared Flowers is a 39 y.o. male, with history of GERD,  HLD, asthma, joint pain, back pain, and MRSA, who presents to the Emergency Department complaining of a fall that occurred yesterday. Patient states he slipped on wet steps and fell backwards, injuring his left wrist and right elbow in an attempt to break his fall. There is also associated mid-upper back pain. He denies head injury or LOC. He denies bowel/bladder incontinence, numbness/weakness of the extremities, or any other injuries. Patient does not take any medications regularly.    Past Medical History  Diagnosis Date  . Acid reflux   . Joint pain   . Back pain   . Erosive esophagitis   . Asthma   . Hyperlipidemia   . MRSA (methicillin resistant Staphylococcus aureus)     admitted to hospital 2010   Past Surgical History  Procedure Laterality Date  . Knee surgery      right knee  . Shoulder surgery     Family History  Problem Relation Age of Onset  . Cancer    . Asthma    . Diabetes    . Kidney disease    . Hypertension Mother   . Cancer Father   . Hypertension Father   . Cancer Sister     breast  . Cancer Brother     lung   History  Substance Use Topics  . Smoking status: Current Every Day Smoker -- 0.50 packs/day    Types: Cigarettes  . Smokeless tobacco: Current User    Types: Snuff  . Alcohol Use: No     Comment: occasional     Review of Systems  Constitutional: Negative for fever and chills.  HENT: Negative for dental problem, facial  swelling and nosebleeds.   Eyes: Negative for visual disturbance.  Respiratory: Negative for cough, chest tightness, shortness of breath, wheezing and stridor.   Cardiovascular: Negative for chest pain.  Gastrointestinal: Negative for nausea, vomiting and abdominal pain.  Genitourinary: Negative for dysuria, hematuria and flank pain.  Musculoskeletal: Positive for back pain and arthralgias (left wrist; right elbow). Negative for joint swelling, gait problem, neck pain and neck stiffness.  Skin: Negative for rash and wound.  Neurological: Negative for syncope, weakness, light-headedness, numbness and headaches.  Hematological: Does not bruise/bleed easily.  Psychiatric/Behavioral: The patient is not nervous/anxious.   All other systems reviewed and are negative.     Allergies  Hydrocodone-acetaminophen and Tramadol  Home Medications   Prior to Admission medications   Medication Sig Start Date End Date Taking? Authorizing Provider  albuterol (PROVENTIL HFA;VENTOLIN HFA) 108 (90 BASE) MCG/ACT inhaler Inhale 1-2 puffs into the lungs every 6 (six) hours as needed for wheezing or shortness of breath. 10/01/13   Babs Sciara, MD  cyclobenzaprine (FLEXERIL) 10 MG tablet Take 1 tablet (10 mg total) by mouth 2 (two) times daily as needed for muscle spasms. 07/29/14   Hope Orlene Och, NP  doxycycline (VIBRA-TABS) 100 MG tablet Take 1 tablet (100 mg  total) by mouth 2 (two) times daily. 02/04/14   Merlyn Albert, MD  gemfibrozil (LOPID) 600 MG tablet Take 1 tablet (600 mg total) by mouth 2 (two) times daily before a meal. 12/31/13   Babs Sciara, MD  ibuprofen (ADVIL,MOTRIN) 800 MG tablet Take 1 tablet (800 mg total) by mouth 3 (three) times daily. 10/14/14   Georgia Delsignore, PA-C  methocarbamol (ROBAXIN) 500 MG tablet Take 1 tablet (500 mg total) by mouth 2 (two) times daily. 10/14/14   Trevelle Mcgurn, PA-C  naproxen (NAPROSYN) 500 MG tablet Take 1 tablet (500 mg total) by mouth 2 (two) times  daily. 07/29/14   Hope Orlene Och, NP  Omega-3 Fatty Acids (FISH OIL PO) Take by mouth. Take 2 every day    Historical Provider, MD   Triage Vitals: BP 139/84 mmHg  Pulse 67  Temp(Src) 98 F (36.7 C) (Oral)  Resp 18  Ht  (1.778 m)  Wt 230 lb (104.327 kg)  BMI 33.00 kg/m2  SpO2 97%  Physical Exam  Constitutional: He is oriented to person, place, and time. He appears well-developed and well-nourished. No distress.  HENT:  Head: Normocephalic and atraumatic.  Nose: Nose normal.  Mouth/Throat: Uvula is midline, oropharynx is clear and moist and mucous membranes are normal.  Eyes: Conjunctivae and EOM are normal. Pupils are equal, round, and reactive to light.  Neck: Normal range of motion. No spinous process tenderness and no muscular tenderness present. No rigidity. Normal range of motion present.  Full ROM without pain Mild midline cervical tenderness No paraspinal tenderness  Cardiovascular: Normal rate, regular rhythm, normal heart sounds and intact distal pulses.   No murmur heard. Pulses:      Radial pulses are 2+ on the right side, and 2+ on the left side.       Dorsalis pedis pulses are 2+ on the right side, and 2+ on the left side.       Posterior tibial pulses are 2+ on the right side, and 2+ on the left side.  Pulmonary/Chest: Effort normal and breath sounds normal. No accessory muscle usage. No respiratory distress. He has no decreased breath sounds. He has no wheezes. He has no rhonchi. He has no rales. He exhibits no tenderness and no bony tenderness.  No seatbelt marks No flail segment, crepitus or deformity Equal chest expansion  Abdominal: Soft. Normal appearance and bowel sounds are normal. There is no tenderness. There is no rigidity, no guarding and no CVA tenderness.  No seatbelt marks Abd soft and nontender  Musculoskeletal: Normal range of motion.       Thoracic back: He exhibits normal range of motion.       Lumbar back: He exhibits normal range of  motion.  Full range of motion of the T-spine and L-spine No tenderness to palpation of the spinous processes of the L-spine Mild midline upper T-spine pain No tenderness to palpation of the paraspinous muscles of the L-spine Pain to palpation of the right elbow at the olecranon without ecchymosis or deformity Pain to palpation of the left wrist over the radial head without ecchymosis or deformity   Lymphadenopathy:    He has no cervical adenopathy.  Neurological: He is alert and oriented to person, place, and time. He has normal reflexes. No cranial nerve deficit. GCS eye subscore is 4. GCS verbal subscore is 5. GCS motor subscore is 6.  Reflex Scores:      Bicep reflexes are 2+ on the right side and  2+ on the left side.      Brachioradialis reflexes are 2+ on the right side and 2+ on the left side.      Patellar reflexes are 2+ on the right side and 2+ on the left side.      Achilles reflexes are 2+ on the right side and 2+ on the left side. Speech is clear and goal oriented, follows commands Normal 5/5 strength in upper and lower extremities bilaterally including dorsiflexion and plantar flexion, strong and equal grip strength Sensation normal to light and sharp touch Moves extremities without ataxia, coordination intact Normal gait and balance No Clonus  Skin: Skin is warm and dry. No rash noted. He is not diaphoretic. No erythema.  Psychiatric: He has a normal mood and affect.  Nursing note and vitals reviewed.   ED Course  Procedures (including critical care time)  DIAGNOSTIC STUDIES: Oxygen Saturation is 97% on room air, adequate by my interpretation.    COORDINATION OF CARE: At 2343 Discussed treatment plan with patient which includes pain medication. Patient agrees.   Labs Review Labs Reviewed - No data to display  Imaging Review No results found.   EKG Interpretation None       Results for orders placed or performed during the hospital encounter of 03/22/13   CBC  Result Value Ref Range   WBC 5.6 4.0 - 10.5 K/uL   RBC 4.81 4.22 - 5.81 MIL/uL   Hemoglobin 13.8 13.0 - 17.0 g/dL   HCT 45.438.6 (L) 09.839.0 - 11.952.0 %   MCV 80.2 78.0 - 100.0 fL   MCH 28.7 26.0 - 34.0 pg   MCHC 35.8 30.0 - 36.0 g/dL   RDW 14.713.8 82.911.5 - 56.215.5 %   Platelets 206 150 - 400 K/uL   Dg Cervical Spine Complete  10/14/2014   CLINICAL DATA:  Posterior neck pain after fall down stairs. Initial encounter.  EXAM: CERVICAL SPINE  4+ VIEWS  COMPARISON:  Cervical spine CT 04/18/2006  FINDINGS: C7-T1 is not well seen due to overlapping soft tissues. Cervical spine alignment is maintained. Vertebral body heights and intervertebral disc spaces are preserved. The dens is intact. Posterior elements appear well-aligned. There is ossification posterior spinous process C6 that is unchanged. There is no evidence of fracture. No prevertebral soft tissue edema.  IMPRESSION: No fracture or subluxation of the cervical spine.   Electronically Signed   By: Rubye OaksMelanie  Ehinger M.D.   On: 10/14/2014 01:48   Dg Thoracic Spine 2 View  10/14/2014   CLINICAL DATA:  Status post fall down left steps. Upper back pain. Initial encounter.  EXAM: THORACIC SPINE - 2 VIEW  COMPARISON:  None.  FINDINGS: There is no evidence of fracture or subluxation. Vertebral bodies demonstrate normal height and alignment. Intervertebral disc spaces are preserved.  The visualized portions of both lungs are clear. The mediastinum is unremarkable in appearance.  IMPRESSION: No evidence of fracture or subluxation along the thoracic spine.   Electronically Signed   By: Roanna RaiderJeffery  Chang M.D.   On: 10/14/2014 01:51   Dg Elbow Complete Right  10/14/2014   CLINICAL DATA:  Status post fall down wet steps, with posterior right elbow pain. Initial encounter.  EXAM: RIGHT ELBOW - COMPLETE 3+ VIEW  COMPARISON:  Right elbow radiographs performed 10/10/2012  FINDINGS: There is no evidence of fracture or dislocation. The visualized joint spaces are preserved. No  significant joint effusion is identified. The soft tissues are unremarkable in appearance.  IMPRESSION: No evidence of fracture or  dislocation.   Electronically Signed   By: Roanna Raider M.D.   On: 10/14/2014 01:52   Dg Wrist Complete Left  10/14/2014   CLINICAL DATA:  Status post fall down wet steps; left ulnar wrist pain. Initial encounter.  EXAM: LEFT WRIST - COMPLETE 3+ VIEW  COMPARISON:  None.  FINDINGS: There is no evidence of fracture or dislocation. The carpal rows are intact, and demonstrate normal alignment. The joint spaces are preserved.  No significant soft tissue abnormalities are seen.  IMPRESSION: No evidence of fracture or dislocation.   Electronically Signed   By: Roanna Raider M.D.   On: 10/14/2014 01:53     MDM   Final diagnoses:  Neck pain  Fall on stairs  Trapezius muscle spasm  Right elbow pain  Left wrist pain   Jared Flowers presents after fall.  Patient X-Ray negative for obvious fracture or dislocation. Pain managed in ED. Pt advised to follow up with orthopedics if symptoms persist for possibility of missed fracture diagnosis. Patient given brace while in ED, conservative therapy recommended and discussed. Patient will be dc home & is agreeable with above plan.  I have personally reviewed patient's vitals, nursing note and any pertinent labs or imaging.  I performed an focused physical exam; undressed when appropriate .    It has been determined that no acute conditions requiring further emergency intervention are present at this time. The patient/guardian have been advised of the diagnosis and plan. I reviewed any labs and imaging including any potential incidental findings. We have discussed signs and symptoms that warrant return to the ED and they are listed in the discharge instructions.    Vital signs are stable at discharge.   BP 139/95 mmHg  Pulse 63  Temp(Src) 98.2 F (36.8 C) (Oral)  Resp 18  Ht 5\' 10"  (1.778 m)  Wt 230 lb (104.327 kg)  BMI  33.00 kg/m2  SpO2 98%   I personally performed the services described in this documentation, which was scribed in my presence. The recorded information has been reviewed and is accurate.   Dahlia Client Ellakate Gonsalves, PA-C 10/17/14 2132  Mirian Mo, MD 10/20/14 [phone]

## 2014-10-14 ENCOUNTER — Emergency Department (HOSPITAL_COMMUNITY): Payer: BLUE CROSS/BLUE SHIELD

## 2014-10-14 DIAGNOSIS — S199XXA Unspecified injury of neck, initial encounter: Secondary | ICD-10-CM | POA: Diagnosis not present

## 2014-10-14 MED ORDER — IBUPROFEN 800 MG PO TABS: 800.0000 mg | ORAL_TABLET | Freq: Three times a day (TID) | ORAL | Status: DC

## 2014-10-14 MED ORDER — METHOCARBAMOL 500 MG PO TABS: 500.0000 mg | ORAL_TABLET | Freq: Two times a day (BID) | ORAL | Status: DC

## 2014-10-14 NOTE — Discharge Instructions (Signed)

## 2015-08-17 ENCOUNTER — Encounter (HOSPITAL_COMMUNITY): Payer: Self-pay | Admitting: Emergency Medicine

## 2015-08-17 ENCOUNTER — Emergency Department (HOSPITAL_COMMUNITY)
Admission: EM | Admit: 2015-08-17 | Discharge: 2015-08-17 | Disposition: A | Discharge status: Home / Self Care | Payer: Medicaid Other | Attending: Emergency Medicine | Admitting: Emergency Medicine

## 2015-08-17 ENCOUNTER — Emergency Department (HOSPITAL_COMMUNITY): Payer: Medicaid Other

## 2015-08-17 DIAGNOSIS — Z792 Long term (current) use of antibiotics: Secondary | ICD-10-CM | POA: Insufficient documentation

## 2015-08-17 DIAGNOSIS — Z791 Long term (current) use of non-steroidal anti-inflammatories (NSAID): Secondary | ICD-10-CM | POA: Insufficient documentation

## 2015-08-17 DIAGNOSIS — Z79899 Other long term (current) drug therapy: Secondary | ICD-10-CM | POA: Diagnosis not present

## 2015-08-17 DIAGNOSIS — F17210 Nicotine dependence, cigarettes, uncomplicated: Secondary | ICD-10-CM | POA: Insufficient documentation

## 2015-08-17 DIAGNOSIS — Z8614 Personal history of Methicillin resistant Staphylococcus aureus infection: Secondary | ICD-10-CM | POA: Diagnosis not present

## 2015-08-17 DIAGNOSIS — M79644 Pain in right finger(s): Secondary | ICD-10-CM | POA: Insufficient documentation

## 2015-08-17 DIAGNOSIS — M79641 Pain in right hand: Secondary | ICD-10-CM

## 2015-08-17 DIAGNOSIS — J45909 Unspecified asthma, uncomplicated: Secondary | ICD-10-CM | POA: Insufficient documentation

## 2015-08-17 DIAGNOSIS — E785 Hyperlipidemia, unspecified: Secondary | ICD-10-CM | POA: Insufficient documentation

## 2015-08-17 DIAGNOSIS — Z8719 Personal history of other diseases of the digestive system: Secondary | ICD-10-CM | POA: Diagnosis not present

## 2015-08-17 MED ORDER — TRAMADOL HCL 50 MG PO TABS
50.0000 mg | ORAL_TABLET | Freq: Once | ORAL | Status: DC
  Filled 2015-08-17: qty 1

## 2015-08-17 MED ORDER — KETOROLAC TROMETHAMINE 60 MG/2ML IM SOLN
60.0000 mg | Freq: Once | INTRAMUSCULAR | Status: AC
  Administered 2015-08-17: 60 mg via INTRAMUSCULAR
  Filled 2015-08-17: qty 2

## 2015-08-17 NOTE — ED Notes (Signed)
Pt from home with c/o swelling and pain in his right hand fingers x 1 week.  Hx of gout in his knee and shoulder.  Pt reports severe pain in his finger joints.  Pt in NAD, A&O.

## 2015-08-17 NOTE — ED Provider Notes (Signed)
History  By signing my name below, I, Jared Flowers, attest that this documentation has been prepared under the direction and in the presence of [employer], PA-C. Electronically Signed: Karle Flowers, ED Scribe. 08/17/2015. 2:16 PM.  Chief Complaint  Patient presents with  . Hand Pain   HPI  HPI Comments:  Jared Flowers is a 39 y.o. male who presents to the Emergency Department complaining of swelling to the finger joints of the right hand that began in the right thumb approximately one week ago. He states the swelling progressed to the other fingers and he is now experiencing severe pain. He states the pain is worse in the morning. He denies alleviating factors. Moving the fingers increases the pain. He has been taking Ibuprofen 800 mg with minimal relief of the pain. He states he called his PCP, Dr. Sudie Flowers, today and was unable to get an appointment. He denies fever or chills. He denies trauma, injury or fall.  Past Medical History  Diagnosis Date  . Acid reflux   . Joint pain   . Back pain   . Erosive esophagitis   . Asthma   . Hyperlipidemia   . MRSA (methicillin resistant Staphylococcus aureus)     admitted to hospital 2010   Past Surgical History  Procedure Laterality Date  . Knee surgery      right knee  . Shoulder surgery     Family History  Problem Relation Age of Onset  . Cancer    . Asthma    . Diabetes    . Kidney disease    . Hypertension Mother   . Cancer Father   . Hypertension Father   . Cancer Sister     breast  . Cancer Brother     lung   Social History  Substance Use Topics  . Smoking status: Current Every Day Smoker -- 0.50 packs/day    Types: Cigarettes  . Smokeless tobacco: Current User    Types: Snuff  . Alcohol Use: No     Comment: occasional     Review of Systems  Constitutional: Negative for fever and chills.  Gastrointestinal: Negative for nausea and vomiting.  Musculoskeletal: Positive for joint swelling and  arthralgias.  Skin: Negative for color change and wound.    Allergies  Hydrocodone-acetaminophen and Tramadol  Home Medications   Prior to Admission medications   Medication Sig Start Date End Date Taking? Authorizing Provider  albuterol (PROVENTIL HFA;VENTOLIN HFA) 108 (90 BASE) MCG/ACT inhaler Inhale 1-2 puffs into the lungs every 6 (six) hours as needed for wheezing or shortness of breath. 10/01/13   Babs Sciara, MD  cyclobenzaprine (FLEXERIL) 10 MG tablet Take 1 tablet (10 mg total) by mouth 2 (two) times daily as needed for muscle spasms. 07/29/14   Hope Orlene Och, NP  doxycycline (VIBRA-TABS) 100 MG tablet Take 1 tablet (100 mg total) by mouth 2 (two) times daily. 02/04/14   Merlyn Albert, MD  gemfibrozil (LOPID) 600 MG tablet Take 1 tablet (600 mg total) by mouth 2 (two) times daily before a meal. 12/31/13   Babs Sciara, MD  ibuprofen (ADVIL,MOTRIN) 800 MG tablet Take 1 tablet (800 mg total) by mouth 3 (three) times daily. 10/14/14   Hannah Muthersbaugh, PA-C  methocarbamol (ROBAXIN) 500 MG tablet Take 1 tablet (500 mg total) by mouth 2 (two) times daily. 10/14/14   Hannah Muthersbaugh, PA-C  naproxen (NAPROSYN) 500 MG tablet Take 1 tablet (500 mg total) by mouth 2 (two) times  daily. 07/29/14   Hope Orlene OchM Neese, NP  Omega-3 Fatty Acids (FISH OIL PO) Take by mouth. Take 2 every day    Historical Provider, MD   Triage Vitals: BP 147/106 mmHg  Pulse 81  Temp(Src) 98.8 F (37.1 C) (Oral)  Resp 18  SpO2 94% Physical Exam  Constitutional: He is oriented to person, place, and time. He appears well-developed and well-nourished.  HENT:  Head: Normocephalic and atraumatic.  Eyes: EOM are normal.  Neck: Normal range of motion.  Cardiovascular: Normal rate.   Right radial pulse 2+. Cap refill less than 3 seconds.  Pulmonary/Chest: Effort normal.  Musculoskeletal: Normal range of motion.  Able to flex and extend all fingers of right hand with pain. No significant swelling to the hand. No  deformity. No erythema or effusion.  Neurological: He is alert and oriented to person, place, and time.  Skin: Skin is warm and dry.  Psychiatric: He has a normal mood and affect. His behavior is normal.  Nursing note and vitals reviewed.   ED Course  Procedures (including critical care time) DIAGNOSTIC STUDIES: Oxygen Saturation is 94% on RA, adequate by my interpretation.   COORDINATION OF CARE: 2:14 PM- Will X-Ray right hand. Will order pain medication prior to X-Ray. Pt verbalizes understanding and agrees to plan.  Medications  ketorolac (TORADOL) injection 60 mg (60 mg Intramuscular Given 08/17/15 1447)    Labs Review Labs Reviewed - No data to display  Imaging Review Dg Hand Complete Right  08/17/2015  CLINICAL DATA:  Right posterior hand pain and swelling for a few weeks, no known injury. EXAM: RIGHT HAND - COMPLETE 3+ VIEW COMPARISON:  None. FINDINGS: No acute osseous or joint abnormality. Mild soft tissue swelling over the metacarpal heads. IMPRESSION: No acute osseous or joint abnormality. Electronically Signed   By: Leanna BattlesMelinda  Blietz M.D.   On: 08/17/2015 14:41   I have personally reviewed and evaluated these images results as part of my medical decision-making.   EKG Interpretation None      MDM   Final diagnoses:  Right hand pain  Patient presents for right hand pain. He has no septic joint or signs of cellulitis. X-ray was ordered to rule out arthritis. Patient X-Ray negative for arthritis, or obvious fracture or dislocation.  Pt advised to follow up with orthopedics. Conservative therapy recommended and discussed such as ibuprofen or Tylenol. Patient will be discharged home & is agreeable with above plan. Returns precautions discussed. Pt appears safe for discharge. I personally performed the services described in this documentation, which was scribed in my presence. The recorded information has been reviewed and is accurate.     Catha GosselinHanna Patel-Mills,  PA-C 08/17/15 1947  Pricilla LovelessScott Goldston, MD 08/20/15 [phone]

## 2015-08-17 NOTE — Discharge Instructions (Signed)
Musculoskeletal Pain Follow up with your primary care physician. Take tylenol or ibuprofen for pain. Musculoskeletal pain is muscle and boney aches and pains. These pains can occur in any part of the body. Your caregiver may treat you without knowing the cause of the pain. They may treat you if blood or urine tests, X-rays, and other tests were normal.  CAUSES There is often not a definite cause or reason for these pains. These pains may be caused by a type of germ (virus). The discomfort may also come from overuse. Overuse includes working out too hard when your body is not fit. Boney aches also come from weather changes. Bone is sensitive to atmospheric pressure changes. HOME CARE INSTRUCTIONS   Ask when your test results will be ready. Make sure you get your test results.  Only take over-the-counter or prescription medicines for pain, discomfort, or fever as directed by your caregiver. If you were given medications for your condition, do not drive, operate machinery or power tools, or sign legal documents for 24 hours. Do not drink alcohol. Do not take sleeping pills or other medications that may interfere with treatment.  Continue all activities unless the activities cause more pain. When the pain lessens, slowly resume normal activities. Gradually increase the intensity and duration of the activities or exercise.  During periods of severe pain, bed rest may be helpful. Lay or sit in any position that is comfortable.  Putting ice on the injured area.  Put ice in a bag.  Place a towel between your skin and the bag.  Leave the ice on for 15 to 20 minutes, 3 to 4 times a day.  Follow up with your caregiver for continued problems and no reason can be found for the pain. If the pain becomes worse or does not go away, it may be necessary to repeat tests or do additional testing. Your caregiver may need to look further for a possible cause. SEEK IMMEDIATE MEDICAL CARE IF:  You have pain that  is getting worse and is not relieved by medications.  You develop chest pain that is associated with shortness or breath, sweating, feeling sick to your stomach (nauseous), or throw up (vomit).  Your pain becomes localized to the abdomen.  You develop any new symptoms that seem different or that concern you. MAKE SURE YOU:   Understand these instructions.  Will watch your condition.  Will get help right away if you are not doing well or get worse.   This information is not intended to replace advice given to you by your health care provider. Make sure you discuss any questions you have with your health care provider.   Document Released: 08/26/2005 Document Revised: 11/18/2011 Document Reviewed: 04/30/2013 Elsevier Interactive Patient Education [employer]2016 [employer]

## 2015-09-11 ENCOUNTER — Emergency Department (HOSPITAL_COMMUNITY)
Admission: EM | Admit: 2015-09-11 | Discharge: 2015-09-11 | Disposition: A | Discharge status: Home / Self Care | Payer: Medicaid Other | Attending: Emergency Medicine | Admitting: Emergency Medicine

## 2015-09-11 ENCOUNTER — Encounter (HOSPITAL_COMMUNITY): Payer: Self-pay | Admitting: Emergency Medicine

## 2015-09-11 DIAGNOSIS — F17210 Nicotine dependence, cigarettes, uncomplicated: Secondary | ICD-10-CM | POA: Insufficient documentation

## 2015-09-11 DIAGNOSIS — M546 Pain in thoracic spine: Secondary | ICD-10-CM | POA: Insufficient documentation

## 2015-09-11 DIAGNOSIS — Z792 Long term (current) use of antibiotics: Secondary | ICD-10-CM | POA: Insufficient documentation

## 2015-09-11 DIAGNOSIS — J45909 Unspecified asthma, uncomplicated: Secondary | ICD-10-CM | POA: Diagnosis not present

## 2015-09-11 DIAGNOSIS — E785 Hyperlipidemia, unspecified: Secondary | ICD-10-CM | POA: Insufficient documentation

## 2015-09-11 DIAGNOSIS — Z791 Long term (current) use of non-steroidal anti-inflammatories (NSAID): Secondary | ICD-10-CM | POA: Diagnosis not present

## 2015-09-11 DIAGNOSIS — Z79899 Other long term (current) drug therapy: Secondary | ICD-10-CM | POA: Insufficient documentation

## 2015-09-11 DIAGNOSIS — Z8614 Personal history of Methicillin resistant Staphylococcus aureus infection: Secondary | ICD-10-CM | POA: Diagnosis not present

## 2015-09-11 DIAGNOSIS — J020 Streptococcal pharyngitis: Secondary | ICD-10-CM

## 2015-09-11 LAB — URINALYSIS, ROUTINE W REFLEX MICROSCOPIC
Bilirubin Urine: NEGATIVE
Glucose, UA: NEGATIVE mg/dL
HGB URINE DIPSTICK: NEGATIVE
Ketones, ur: NEGATIVE mg/dL
Leukocytes, UA: NEGATIVE
Nitrite: NEGATIVE
PH: 5.5 (ref 5.0–8.0)
Protein, ur: NEGATIVE mg/dL
SPECIFIC GRAVITY, URINE: 1.029 (ref 1.005–1.030)

## 2015-09-11 LAB — RAPID STREP SCREEN (MED CTR MEBANE ONLY): Streptococcus, Group A Screen (Direct): POSITIVE — AB

## 2015-09-11 MED ORDER — METHOCARBAMOL 500 MG PO TABS: 500.0000 mg | ORAL_TABLET | Freq: Two times a day (BID) | ORAL | Status: DC

## 2015-09-11 MED ORDER — KETOROLAC TROMETHAMINE 60 MG/2ML IM SOLN
60.0000 mg | Freq: Once | INTRAMUSCULAR | Status: AC
Start: 2015-09-11 — End: 2015-09-11
  Administered 2015-09-11: 60 mg via INTRAMUSCULAR
  Filled 2015-09-11: qty 2

## 2015-09-11 MED ORDER — PENICILLIN V POTASSIUM 500 MG PO TABS: 500.0000 mg | ORAL_TABLET | Freq: Two times a day (BID) | ORAL | Status: AC

## 2015-09-11 MED ORDER — PENICILLIN V POTASSIUM 500 MG PO TABS: 500.0000 mg | ORAL_TABLET | Freq: Two times a day (BID) | ORAL | Status: DC

## 2015-09-11 MED ORDER — METHOCARBAMOL 500 MG PO TABS
1000.0000 mg | ORAL_TABLET | Freq: Once | ORAL | Status: AC
  Administered 2015-09-11: 1000 mg via ORAL
  Filled 2015-09-11: qty 2

## 2015-09-11 NOTE — ED Notes (Signed)
Pt. reports mid/left back pain onset this morning , denies injury or fall, pain increases with movement , changing positions and deep inspirations , denies hematuria ,respirations unlabored . Pt. stated history of chronic back pain /heavy lifting at work .

## 2015-09-11 NOTE — ED Provider Notes (Signed)
CSN: [id number]     Arrival date & time 09/11/15  1938 History  By signing my name below, I, Starleen Arms, attest that this documentation has been prepared under the direction and in the presence of Wendie Simmer, PA-C. Electronically Signed: Starleen Arms ED Scribe. 09/11/2015. 8:39 PM.    Chief Complaint  Patient presents with  . Back Pain   The history is provided by the patient. No language interpreter was used.   HPI Comments: Jared Flowers is a 40 y.o. male who presents to the Emergency Department complaining of left mid-back pain described as spasms onset this morning; worse with twisting, breathing, and standing.  He reports a prior hx of similar episode four years ago and was seen by orthopedist, Dr. Durward Fortes.  He states he was dx'd with muscle spasm which resolved in several months with medication management.  He denies injury but he does heavy lifting for work.  No fever since onset, bowel/bladder incontinence, chills, recent back injections, urinary symptoms.    The patient also states he has an improving sore "strep" throat with white spots onset several days ago.  He has not been evaluated for this and has taken no medication.    Past Medical History  Diagnosis Date  . Acid reflux   . Joint pain   . Back pain   . Erosive esophagitis   . Asthma   . Hyperlipidemia   . MRSA (methicillin resistant Staphylococcus aureus)     admitted to hospital 2010   Past Surgical History  Procedure Laterality Date  . Knee surgery      right knee  . Shoulder surgery     Family History  Problem Relation Age of Onset  . Cancer    . Asthma    . Diabetes    . Kidney disease    . Hypertension Mother   . Cancer Father   . Hypertension Father   . Cancer Sister     breast  . Cancer Brother     lung   Social History  Substance Use Topics  . Smoking status: Current Every Day Smoker -- 0.00 packs/day    Types: Cigarettes  . Smokeless tobacco: Current User  . Alcohol Use: No      Comment: occasional     Review of Systems 10 Systems reviewed and all are negative for acute change except as noted in the HPI.   Allergies  Hydrocodone-acetaminophen and Tramadol  Home Medications   Prior to Admission medications   Medication Sig Start Date End Date Taking? Authorizing Provider  albuterol (PROVENTIL HFA;VENTOLIN HFA) 108 (90 BASE) MCG/ACT inhaler Inhale 1-2 puffs into the lungs every 6 (six) hours as needed for wheezing or shortness of breath. 10/01/13   Kathyrn Drown, MD  cyclobenzaprine (FLEXERIL) 10 MG tablet Take 1 tablet (10 mg total) by mouth 2 (two) times daily as needed for muscle spasms. 07/29/14   Hope Bunnie Pion, NP  doxycycline (VIBRA-TABS) 100 MG tablet Take 1 tablet (100 mg total) by mouth 2 (two) times daily. 02/04/14   Mikey Kirschner, MD  gemfibrozil (LOPID) 600 MG tablet Take 1 tablet (600 mg total) by mouth 2 (two) times daily before a meal. 12/31/13   Kathyrn Drown, MD  ibuprofen (ADVIL,MOTRIN) 800 MG tablet Take 1 tablet (800 mg total) by mouth 3 (three) times daily. 10/14/14   Hannah Muthersbaugh, PA-C  methocarbamol (ROBAXIN) 500 MG tablet Take 1 tablet (500 mg total) by mouth 2 (two) times daily.  10/14/14   Hannah Muthersbaugh, PA-C  naproxen (NAPROSYN) 500 MG tablet Take 1 tablet (500 mg total) by mouth 2 (two) times daily. 07/29/14   Hope Bunnie Pion, NP  Omega-3 Fatty Acids (FISH OIL PO) Take by mouth. Take 2 every day    Historical Provider, MD   BP 140/84 mmHg  Pulse 80  Temp(Src) 98.4 F (36.9 C) (Oral)  Resp 18  SpO2 99% Physical Exam  Constitutional: He is oriented to person, place, and time. He appears well-developed and well-nourished. No distress.  HENT:  Head: Normocephalic and atraumatic.  Right Ear: External ear normal.  Left Ear: External ear normal.  Nose: Nose normal.  Mouth/Throat: Oropharynx is clear and moist. No oropharyngeal exudate.  Eyes: Conjunctivae and EOM are normal. Right eye exhibits no discharge. Left eye exhibits no  discharge. No scleral icterus.  Neck: Neck supple. No tracheal deviation present.  Cardiovascular: Normal rate, regular rhythm and normal heart sounds.  Exam reveals no gallop and no friction rub.   No murmur heard. Pulmonary/Chest: Effort normal and breath sounds normal. No respiratory distress. He has no wheezes. He has no rales. He exhibits no tenderness.  Abdominal: Soft. He exhibits no mass. There is tenderness. There is no rebound and no guarding.  Left CVA tenderness   Musculoskeletal: Normal range of motion. He exhibits no edema or tenderness.  No midline tenderness along cervical, thoracic, and lumbar spine.   Neurological: He is alert and oriented to person, place, and time. No cranial nerve deficit.  Skin: Skin is warm and dry. No rash noted. No erythema.  Psychiatric: He has a normal mood and affect. His behavior is normal.  Nursing note and vitals reviewed.   ED Course  Procedures   DIAGNOSTIC STUDIES: Oxygen Saturation is 99% on RA, normal by my interpretation.    COORDINATION OF CARE:  8:40 PM Discussed treatment plan with patient at bedside.  Patient acknowledges and agrees with plan.    Labs Review Labs Reviewed  RAPID STREP SCREEN (NOT AT [facility]) - Abnormal; Notable for the following:    Streptococcus, Group A Screen (Direct) POSITIVE (*)    All other components within normal limits  URINALYSIS, ROUTINE W REFLEX MICROSCOPIC (NOT AT [facility])    MDM   Final diagnoses:  Left-sided thoracic back pain  Strep throat   Patient non-toxic appearing and VSS. Based on patient history and physical exam, most likely etiologies include MSK strain vs kidney stone. Less likely etiologies include ruptured AAA, pancreatitis, cholecystitis, ulcer, vertebral compression fracture, retroperitoneal bleed, spinal epidural abscess, discitis, osteomyelitis, pyelonephritis/perinephric abscess, cauda equina syndrome, herniated disc, spinal stenosis, fibromyalgia, rheumatoid arthritis,  spondylitis, osteoarthritis, nephrolithiasis, multiple myeloma, bony metastasis. Imaging not indicated at this time. Will get UA to evaluate for hematuria and strep screen to evaluate for untreated strep.   Strep positive. Patient will go home with PCN. UA negative for hematuria.  Patient feeling better upon reassessment.  Patient may be safely discharged home. Discussed reasons for return. Patient to follow-up with primary care provider within one week. Patient in understanding and agreement with the plan.  I personally performed the services described in this documentation, which was scribed in my presence. The recorded information has been reviewed and is accurate.   Sandwich Lions, PA-C 09/13/15 1206  Noemi Chapel, MD 09/16/15 [phone]

## 2015-09-11 NOTE — Discharge Instructions (Signed)
[name],  Nice meeting you! Please follow-up with your primary care provider. Return to the emergency department if you develop fevers, chills, inability to walk, loss of bladder/bowel control. Feel better soon!  S. Lane HackerNicole Hind Chesler, PA-C   Back Pain, Adult Back pain is very common in adults.The cause of back pain is rarely dangerous and the pain often gets better over time.The cause of your back pain may not be known. Some common causes of back pain include:  Strain of the muscles or ligaments supporting the spine.  Wear and tear (degeneration) of the spinal disks.  Arthritis.  Direct injury to the back. For many people, back pain may return. Since back pain is rarely dangerous, most people can learn to manage this condition on their own. HOME CARE INSTRUCTIONS Watch your back pain for any changes. The following actions may help to lessen any discomfort you are feeling:  Remain active. It is stressful on your back to sit or stand in one place for long periods of time. Do not sit, drive, or stand in one place for more than 30 minutes at a time. Take short walks on even surfaces as soon as you are able.Try to increase the length of time you walk each day.  Exercise regularly as directed by your health care provider. Exercise helps your back heal faster. It also helps avoid future injury by keeping your muscles strong and flexible.  Do not stay in bed.Resting more than 1-2 days can delay your recovery.  Pay attention to your body when you bend and lift. The most comfortable positions are those that put less stress on your recovering back. Always use proper lifting techniques, including:  Bending your knees.  Keeping the load close to your body.  Avoiding twisting.  Find a comfortable position to sleep. Use a firm mattress and lie on your side with your knees slightly bent. If you lie on your back, put a pillow under your knees.  Avoid feeling anxious or  stressed.Stress increases muscle tension and can worsen back pain.It is important to recognize when you are anxious or stressed and learn ways to manage it, such as with exercise.  Take medicines only as directed by your health care provider. Over-the-counter medicines to reduce pain and inflammation are often the most helpful.Your health care provider may prescribe muscle relaxant drugs.These medicines help dull your pain so you can more quickly return to your normal activities and healthy exercise.  Apply ice to the injured area:  Put ice in a plastic bag.  Place a towel between your skin and the bag.  Leave the ice on for 20 minutes, 2-3 times a day for the first 2-3 days. After that, ice and heat may be alternated to reduce pain and spasms.  Maintain a healthy weight. Excess weight puts extra stress on your back and makes it difficult to maintain good posture. SEEK MEDICAL CARE IF:  You have pain that is not relieved with rest or medicine.  You have increasing pain going down into the legs or buttocks.  You have pain that does not improve in one week.  You have night pain.  You lose weight.  You have a fever or chills. SEEK IMMEDIATE MEDICAL CARE IF:   You develop new bowel or bladder control problems.  You have unusual weakness or numbness in your arms or legs.  You develop nausea or vomiting.  You develop abdominal pain.  You feel faint.   This information is not  intended to replace advice given to you by your health care provider. Make sure you discuss any questions you have with your health care provider.   Document Released: 08/26/2005 Document Revised: 09/16/2014 Document Reviewed: 12/28/2013 Elsevier Interactive Patient Education [employer].

## 2015-09-20 ENCOUNTER — Emergency Department (HOSPITAL_COMMUNITY)
Admission: EM | Admit: 2015-09-20 | Discharge: 2015-09-20 | Disposition: A | Discharge status: Home / Self Care | Payer: Medicaid Other | Attending: Emergency Medicine | Admitting: Emergency Medicine

## 2015-09-20 ENCOUNTER — Emergency Department (HOSPITAL_COMMUNITY): Payer: Medicaid Other

## 2015-09-20 ENCOUNTER — Encounter (HOSPITAL_COMMUNITY): Payer: Self-pay | Admitting: *Deleted

## 2015-09-20 DIAGNOSIS — I10 Essential (primary) hypertension: Secondary | ICD-10-CM | POA: Diagnosis not present

## 2015-09-20 DIAGNOSIS — Z8614 Personal history of Methicillin resistant Staphylococcus aureus infection: Secondary | ICD-10-CM | POA: Diagnosis not present

## 2015-09-20 DIAGNOSIS — R1084 Generalized abdominal pain: Secondary | ICD-10-CM

## 2015-09-20 DIAGNOSIS — J45909 Unspecified asthma, uncomplicated: Secondary | ICD-10-CM | POA: Diagnosis not present

## 2015-09-20 DIAGNOSIS — Z8719 Personal history of other diseases of the digestive system: Secondary | ICD-10-CM | POA: Insufficient documentation

## 2015-09-20 DIAGNOSIS — Z79899 Other long term (current) drug therapy: Secondary | ICD-10-CM | POA: Diagnosis not present

## 2015-09-20 DIAGNOSIS — Z8639 Personal history of other endocrine, nutritional and metabolic disease: Secondary | ICD-10-CM | POA: Insufficient documentation

## 2015-09-20 DIAGNOSIS — R109 Unspecified abdominal pain: Secondary | ICD-10-CM | POA: Diagnosis present

## 2015-09-20 DIAGNOSIS — F17210 Nicotine dependence, cigarettes, uncomplicated: Secondary | ICD-10-CM | POA: Diagnosis not present

## 2015-09-20 HISTORY — DX: Essential (primary) hypertension: I10

## 2015-09-20 LAB — COMPREHENSIVE METABOLIC PANEL
ALT: 26 U/L (ref 17–63)
AST: 26 U/L (ref 15–41)
Albumin: 3.6 g/dL (ref 3.5–5.0)
Alkaline Phosphatase: 71 U/L (ref 38–126)
Anion gap: 8 (ref 5–15)
BILIRUBIN TOTAL: 0.5 mg/dL (ref 0.3–1.2)
BUN: 11 mg/dL (ref 6–20)
CHLORIDE: 108 mmol/L (ref 101–111)
CO2: 24 mmol/L (ref 22–32)
Calcium: 9.4 mg/dL (ref 8.9–10.3)
Creatinine, Ser: 1.02 mg/dL (ref 0.61–1.24)
GFR calc non Af Amer: >60 mL/min (ref >60)
Glucose, Bld: 135 mg/dL — ABNORMAL HIGH (ref 65–99)
Potassium: 4 mmol/L (ref 3.5–5.1)
Sodium: 140 mmol/L (ref 135–145)
Total Protein: 6.7 g/dL (ref 6.5–8.1)

## 2015-09-20 LAB — CBC WITH DIFFERENTIAL/PLATELET
BASOS ABS: 0 10*3/uL (ref 0.0–0.1)
Basophils Relative: 0 %
EOS ABS: 0.1 10*3/uL (ref 0.0–0.7)
Eosinophils Relative: 1 %
HCT: 37.7 % — ABNORMAL LOW (ref 39.0–52.0)
Hemoglobin: 12.9 g/dL — ABNORMAL LOW (ref 13.0–17.0)
LYMPHS PCT: 34 %
Lymphs Abs: 2.2 10*3/uL (ref 0.7–4.0)
MCH: 27.8 pg (ref 26.0–34.0)
MCHC: 34.2 g/dL (ref 30.0–36.0)
MCV: 81.3 fL (ref 78.0–100.0)
Monocytes Absolute: 0.3 10*3/uL (ref 0.1–1.0)
Monocytes Relative: 5 %
Neutro Abs: 3.9 10*3/uL (ref 1.7–7.7)
Neutrophils Relative %: 60 %
PLATELETS: 234 10*3/uL (ref 150–400)
RBC: 4.64 MIL/uL (ref 4.22–5.81)
RDW: 13.6 % (ref 11.5–15.5)
WBC: 6.5 10*3/uL (ref 4.0–10.5)

## 2015-09-20 LAB — URINALYSIS, ROUTINE W REFLEX MICROSCOPIC
Bilirubin Urine: NEGATIVE
Glucose, UA: NEGATIVE mg/dL
HGB URINE DIPSTICK: NEGATIVE
Ketones, ur: NEGATIVE mg/dL
Leukocytes, UA: NEGATIVE
NITRITE: NEGATIVE
PH: 6 (ref 5.0–8.0)
Protein, ur: NEGATIVE mg/dL
SPECIFIC GRAVITY, URINE: 1.034 — AB (ref 1.005–1.030)

## 2015-09-20 LAB — LIPASE, BLOOD: LIPASE: 35 U/L (ref 11–51)

## 2015-09-20 LAB — POC OCCULT BLOOD, ED: FECAL OCCULT BLD: NEGATIVE

## 2015-09-20 MED ORDER — GI COCKTAIL ~~LOC~~
30.0000 mL | Freq: Once | ORAL | Status: AC
  Administered 2015-09-20: 30 mL via ORAL
  Filled 2015-09-20: qty 30

## 2015-09-20 MED ORDER — PANTOPRAZOLE SODIUM 40 MG IV SOLR
40.0000 mg | Freq: Once | INTRAVENOUS | Status: AC
  Administered 2015-09-20: 40 mg via INTRAVENOUS
  Filled 2015-09-20: qty 40

## 2015-09-20 MED ORDER — IOHEXOL 300 MG/ML  SOLN
100.0000 mL | Freq: Once | INTRAMUSCULAR | Status: AC | PRN
  Administered 2015-09-20: 100 mL via INTRAVENOUS

## 2015-09-20 MED ORDER — IOHEXOL 300 MG/ML  SOLN
25.0000 mL | Freq: Once | INTRAMUSCULAR | Status: AC | PRN
  Administered 2015-09-20: 25 mL via ORAL

## 2015-09-20 MED ORDER — DICYCLOMINE HCL 20 MG PO TABS: 20.0000 mg | ORAL_TABLET | Freq: Two times a day (BID) | ORAL | Status: DC

## 2015-09-20 MED ORDER — SODIUM CHLORIDE 0.9 % IV BOLUS (SEPSIS)
1000.0000 mL | Freq: Once | INTRAVENOUS | Status: AC
  Administered 2015-09-20: 1000 mL via INTRAVENOUS

## 2015-09-20 NOTE — ED Notes (Signed)
Patient transported to CT 

## 2015-09-20 NOTE — Discharge Instructions (Signed)
You were seen in the emergency room today for evaluation of abdominal pain and rectal bleeding. There was no evidence of blood on your rectal exam. Your labs and CT scan were also normal. I will give you a prescription for Bentyl to help with your abdominal pain. Since your strep throat appears to be resolved and you have almost finished your antibiotics at this point anyway, you may discontinue taking the penicillin that you were prescribed as it may be contributing to your symptoms. Please follow up with GI. Please also follow up with your primary care provider.

## 2015-09-20 NOTE — ED Notes (Signed)
Pt has c/o hemorrhoids with a hx of such. Pt states he is having bright red bleeding from rectum with no pain. Pt undressed and placed in a gown.

## 2015-09-20 NOTE — ED Notes (Signed)
Pt reports abdominal pain and rectal bleeding for several days after starting a new prescription.

## 2015-09-20 NOTE — ED Provider Notes (Signed)
CSN: [id number]     Arrival date & time 09/20/15  1002 History   First MD Initiated Contact with Patient 09/20/15 1039     Chief Complaint  Patient presents with  . Hemorrhoids    HPI  Jared Flowers is an 40 y.o. male with history of acid reflux, ulcerative colitis, HTN, HLD who presents to the ED for evaluation of abdominal pain and BRBPR. He states his symptoms began yesterday. He states yesterday morning at work he began feeling a burning pain diffusely throughout his abdomen. He reports associated 15 episodes of painless BRBPR throughout the day as well. Denies black tarry stools. Denies nausea or vomiting. Denies feeling dizzy or lightheaded. He states something similar happened several years ago but he does not remember what the doctors did at that time. He states his last colonoscopy was two years ago, last CT abd/pelvis last year. They were not visible on EMR review. He states he does not follow with GI. Pt states he was also prescribed robaxin for back pain and penicillin for strep throat one week ago which he has been taking as prescribed. He states his last dose of each medication was yesterday and states that today since he has not taken any of the medicine he has not had any further episodes of rectal bleeding. He continues to endorse abdominal pain. Denies fever, chills, or urinary issues.   Past Medical History  Diagnosis Date  . Acid reflux   . Joint pain   . Back pain   . Erosive esophagitis   . Asthma   . Hyperlipidemia   . MRSA (methicillin resistant Staphylococcus aureus)     admitted to hospital 2010  . Hypertension    Past Surgical History  Procedure Laterality Date  . Knee surgery      right knee  . Shoulder surgery     Family History  Problem Relation Age of Onset  . Cancer    . Asthma    . Diabetes    . Kidney disease    . Hypertension Mother   . Cancer Father   . Hypertension Father   . Cancer Sister     breast  . Cancer Brother     lung   Social  History  Substance Use Topics  . Smoking status: Current Every Day Smoker -- 0.00 packs/day    Types: Cigarettes  . Smokeless tobacco: Current User  . Alcohol Use: No     Comment: occasional     Review of Systems  All other systems reviewed and are negative.     Allergies  Hydrocodone-acetaminophen and Tramadol  Home Medications   Prior to Admission medications   Medication Sig Start Date End Date Taking? Authorizing Provider  albuterol (PROVENTIL HFA;VENTOLIN HFA) 108 (90 BASE) MCG/ACT inhaler Inhale 1-2 puffs into the lungs every 6 (six) hours as needed for wheezing or shortness of breath. 10/01/13  Yes Babs Sciara, MD  ibuprofen (ADVIL,MOTRIN) 800 MG tablet Take 800 mg by mouth every 8 (eight) hours as needed (pain).   Yes Historical Provider, MD  methocarbamol (ROBAXIN) 500 MG tablet Take 1 tablet (500 mg total) by mouth 2 (two) times daily. 09/11/15  Yes Melton Krebs, PA-C  penicillin v potassium (VEETID) 500 MG tablet Take 500 mg by mouth 2 (two) times daily.   Yes Historical Provider, MD  gemfibrozil (LOPID) 600 MG tablet Take 1 tablet (600 mg total) by mouth 2 (two) times daily before a meal. Patient not taking: Reported  on 09/11/2015 12/31/13   Babs SciaraScott A Luking, MD   BP 140/80 mmHg  Pulse 73  Temp(Src) 98.5 F (36.9 C) (Oral)  Resp 20  SpO2 100% Physical Exam  Constitutional: He is oriented to person, place, and time.  HENT:  Right Ear: External ear normal.  Left Ear: External ear normal.  Nose: Nose normal.  Mouth/Throat: Oropharynx is clear and moist. No oropharyngeal exudate.  Eyes: Conjunctivae and EOM are normal. Pupils are equal, round, and reactive to light.  Neck: Normal range of motion. Neck supple.  Cardiovascular: Normal rate, regular rhythm, normal heart sounds and intact distal pulses.   Pulmonary/Chest: Effort normal and breath sounds normal. No respiratory distress. He has no wheezes. He exhibits no tenderness.  Abdominal: Soft. Bowel sounds  are normal. He exhibits no distension. There is no rebound and no CVA tenderness.  Abdomen is diffusely tender with some guarding, though soft and nondistended  Genitourinary: Guaiac negative stool.  Rectal vault tender on DRE. No stool in vault. No blood or stool visible on glove. No masses palpable. No external hemorrhoids visualized.  Musculoskeletal: He exhibits no edema.  Neurological: He is alert and oriented to person, place, and time. No cranial nerve deficit.  Skin: Skin is warm and dry.  Psychiatric: He has a normal mood and affect.  Nursing note and vitals reviewed.  Filed Vitals:   09/20/15 1145 09/20/15 1200 09/20/15 1215 09/20/15 1300  BP: 137/86 129/88 143/96 133/81  Pulse: 52 55 58 50  Temp:      TempSrc:      Resp:      SpO2: 100% 100% 100% 99%     ED Course  Procedures (including critical care time) Labs Review Labs Reviewed  COMPREHENSIVE METABOLIC PANEL - Abnormal; Notable for the following:    Glucose, Bld 135 (*)    All other components within normal limits  CBC WITH DIFFERENTIAL/PLATELET - Abnormal; Notable for the following:    Hemoglobin 12.9 (*)    HCT 37.7 (*)    All other components within normal limits  URINALYSIS, ROUTINE W REFLEX MICROSCOPIC (NOT AT [facility]) - Abnormal; Notable for the following:    Specific Gravity, Urine 1.034 (*)    All other components within normal limits  LIPASE, BLOOD  POC OCCULT BLOOD, ED    Imaging Review Ct Abdomen Pelvis W Contrast  09/20/2015  CLINICAL DATA:  Diffuse abdominal pain and blood in stool since 09/19/2015. Initial encounter. EXAM: CT ABDOMEN AND PELVIS WITH CONTRAST TECHNIQUE: Multidetector CT imaging of the abdomen and pelvis was performed using the standard protocol following bolus administration of intravenous contrast. CONTRAST:  100 mL OMNIPAQUE IOHEXOL 300 MG/ML  SOLN COMPARISON:  None. FINDINGS: The lung bases are clear. No pleural or pericardial effusion. Calcific coronary artery disease is  identified. Heart size is normal. The liver, gallbladder, spleen, adrenal glands, kidneys and pancreas appear normal. Urinary bladder, seminal vesicles and prostate gland are unremarkable. The stomach, small and large bowel and appendix appear normal. There is no lymphadenopathy or fluid. No bony abnormality is identified. IMPRESSION: No acute finding abdomen or pelvis. No abnormality to explain the patient's symptoms. Calcific coronary artery disease. Electronically Signed   By: Drusilla Kannerhomas  Dalessio M.D.   On: 09/20/2015 11:56   I have personally reviewed and evaluated these images and lab results as part of my medical decision-making.   EKG Interpretation None      MDM   Final diagnoses:  Generalized abdominal pain    FOBT negative. CT  abd/pelvis unremarkable for cause of rectal bleeding. THere is calcific CAD. Lab otherwise at baseline. Pt reports improvement in pain with medication. Discussed with attending MD Pfeiffer. At this time pt is hemodynamically stable. No evidence of blood on DRE and FOBT negative. Will give rx for bentyl PRN. Discussed discontinuing PCN in case it is contributing to pt's symptoms. Pt instructed to f/u with PCP and referral given to GI for further evaluation when financially feasible. ER return precautions given.    Carlene Coria, PA-C 09/20/15 1941  Arby Barrette, MD 09/21/15 1346

## 2015-09-20 NOTE — ED Notes (Signed)
Pt is in stable condition upon d/c and ambulates from ED. 

## 2015-11-15 ENCOUNTER — Emergency Department (HOSPITAL_COMMUNITY)
Admission: EM | Admit: 2015-11-15 | Discharge: 2015-11-15 | Disposition: A | Discharge status: Home / Self Care | Payer: Medicaid Other | Attending: Emergency Medicine | Admitting: Emergency Medicine

## 2015-11-15 ENCOUNTER — Emergency Department (HOSPITAL_COMMUNITY): Payer: Medicaid Other

## 2015-11-15 DIAGNOSIS — Y998 Other external cause status: Secondary | ICD-10-CM | POA: Diagnosis not present

## 2015-11-15 DIAGNOSIS — S62633A Displaced fracture of distal phalanx of left middle finger, initial encounter for closed fracture: Secondary | ICD-10-CM | POA: Diagnosis not present

## 2015-11-15 DIAGNOSIS — J45909 Unspecified asthma, uncomplicated: Secondary | ICD-10-CM | POA: Insufficient documentation

## 2015-11-15 DIAGNOSIS — Y9289 Other specified places as the place of occurrence of the external cause: Secondary | ICD-10-CM | POA: Insufficient documentation

## 2015-11-15 DIAGNOSIS — E785 Hyperlipidemia, unspecified: Secondary | ICD-10-CM | POA: Diagnosis not present

## 2015-11-15 DIAGNOSIS — Z79899 Other long term (current) drug therapy: Secondary | ICD-10-CM | POA: Insufficient documentation

## 2015-11-15 DIAGNOSIS — T1490XA Injury, unspecified, initial encounter: Secondary | ICD-10-CM

## 2015-11-15 DIAGNOSIS — W231XXA Caught, crushed, jammed, or pinched between stationary objects, initial encounter: Secondary | ICD-10-CM | POA: Diagnosis not present

## 2015-11-15 DIAGNOSIS — Z8614 Personal history of Methicillin resistant Staphylococcus aureus infection: Secondary | ICD-10-CM | POA: Diagnosis not present

## 2015-11-15 DIAGNOSIS — S62639A Displaced fracture of distal phalanx of unspecified finger, initial encounter for closed fracture: Secondary | ICD-10-CM

## 2015-11-15 DIAGNOSIS — F17210 Nicotine dependence, cigarettes, uncomplicated: Secondary | ICD-10-CM | POA: Diagnosis not present

## 2015-11-15 DIAGNOSIS — Z8719 Personal history of other diseases of the digestive system: Secondary | ICD-10-CM | POA: Insufficient documentation

## 2015-11-15 DIAGNOSIS — I10 Essential (primary) hypertension: Secondary | ICD-10-CM | POA: Diagnosis not present

## 2015-11-15 DIAGNOSIS — Y9389 Activity, other specified: Secondary | ICD-10-CM | POA: Insufficient documentation

## 2015-11-15 DIAGNOSIS — S6992XA Unspecified injury of left wrist, hand and finger(s), initial encounter: Secondary | ICD-10-CM | POA: Diagnosis present

## 2015-11-15 DIAGNOSIS — M79645 Pain in left finger(s): Secondary | ICD-10-CM

## 2015-11-15 MED ORDER — NAPROXEN 500 MG PO TABS: 500.0000 mg | ORAL_TABLET | Freq: Two times a day (BID) | ORAL | Status: DC

## 2015-11-15 NOTE — ED Notes (Addendum)
Wound care completed by RN- pt aware of s/sx of infection-PA at bedside

## 2015-11-15 NOTE — ED Notes (Signed)
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 Pt noted to have swelling to left middle finger. Pt reports decreased sensation to middle finger. Noted to have a laceration to the index finger as well.

## 2015-11-15 NOTE — ED Provider Notes (Signed)
Medical screening examination/treatment/procedure(s) were performed by non-physician practitioner and as supervising physician I was immediately available for consultation/collaboration.   EKG Interpretation None      Results for orders placed or performed during the hospital encounter of 09/20/15  Comprehensive metabolic panel  Result Value Ref Range   Sodium 140 135 - 145 mmol/L   Potassium 4.0 3.5 - 5.1 mmol/L   Chloride 108 101 - 111 mmol/L   CO2 24 22 - 32 mmol/L   Glucose, Bld 135 (H) 65 - 99 mg/dL   BUN 11 6 - 20 mg/dL   Creatinine, Ser 1.611.02 0.61 - 1.24 mg/dL   Calcium 9.4 8.9 - 09.610.3 mg/dL   Total Protein 6.7 6.5 - 8.1 g/dL   Albumin 3.6 3.5 - 5.0 g/dL   AST 26 15 - 41 U/L   ALT 26 17 - 63 U/L   Alkaline Phosphatase 71 38 - 126 U/L   Total Bilirubin 0.5 0.3 - 1.2 mg/dL   GFR calc non Af Amer >60 >60 mL/min   GFR calc Af Amer >60 >60 mL/min   Anion gap 8 5 - 15  Lipase, blood  Result Value Ref Range   Lipase 35 11 - 51 U/L  CBC with Differential  Result Value Ref Range   WBC 6.5 4.0 - 10.5 K/uL   RBC 4.64 4.22 - 5.81 MIL/uL   Hemoglobin 12.9 (L) 13.0 - 17.0 g/dL   HCT 04.537.7 (L) 40.939.0 - 81.152.0 %   MCV 81.3 78.0 - 100.0 fL   MCH 27.8 26.0 - 34.0 pg   MCHC 34.2 30.0 - 36.0 g/dL   RDW 91.413.6 78.211.5 - 95.615.5 %   Platelets 234 150 - 400 K/uL   Neutrophils Relative % 60 %   Neutro Abs 3.9 1.7 - 7.7 K/uL   Lymphocytes Relative 34 %   Lymphs Abs 2.2 0.7 - 4.0 K/uL   Monocytes Relative 5 %   Monocytes Absolute 0.3 0.1 - 1.0 K/uL   Eosinophils Relative 1 %   Eosinophils Absolute 0.1 0.0 - 0.7 K/uL   Basophils Relative 0 %   Basophils Absolute 0.0 0.0 - 0.1 K/uL  Urinalysis, Routine w reflex microscopic (not [facility])  Result Value Ref Range   Color, Urine YELLOW YELLOW   APPearance CLEAR CLEAR   Specific Gravity, Urine 1.034 (H) 1.005 - 1.030   pH 6.0 5.0 - 8.0   Glucose, UA NEGATIVE NEGATIVE mg/dL   Hgb urine dipstick NEGATIVE NEGATIVE   Bilirubin Urine NEGATIVE NEGATIVE    Ketones, ur NEGATIVE NEGATIVE mg/dL   Protein, ur NEGATIVE NEGATIVE mg/dL   Nitrite NEGATIVE NEGATIVE   Leukocytes, UA NEGATIVE NEGATIVE  POC occult blood, ED  Result Value Ref Range   Fecal Occult Bld NEGATIVE NEGATIVE   Dg Hand Complete Left  11/15/2015  CLINICAL DATA:  Crush injury of the index and middle finger last evening with generalized swelling of the middle finger with laceration over the PIP joint, diffuse pain over the index finger with swelling as well. EXAM: LEFT HAND - COMPLETE 3+ VIEW COMPARISON:  None in PACs FINDINGS: The bones are adequately mineralized. There is bony density adjacent to the radial aspect of the tuft of the third or long finger which may reflect an avulsion. The interphalangeal and metacarpophalangeal joint spaces are reasonably well-maintained. There is very mild soft tissue swelling diffusely of the index and third fingers. The metacarpals are intact. IMPRESSION: Probable fracture of the medial aspect of the tuft of the distal  phalanx of the third finger. The age of this is unclear. There is mild diffuse soft tissue swelling over the index and third fingers. No acute bony abnormality is observed elsewhere. Electronically Signed   By: David  [place] M.D.   On: 11/15/2015 10:06    Patient is right-hand dominant. 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 Refill is 1 second sensations intact limited range of motion of the 2 fingers. X-rays show evidence of soft tissue swelling of the index and third finger. There is questionable injury to the distal phalanx of the third finger. We'll treat the pain splint and have him follow-up with hand surgery. In addition there is a laceration measuring about 1 cm to the palmar side of the index finger. This currently is sealed. No evidence of infection.  Vanetta Mulders, MD 11/15/15 [phone]

## 2015-11-15 NOTE — ED Provider Notes (Signed)
CSN: [id number]     Arrival date & time 11/15/15  [id number] History   First MD Initiated Contact with Patient 11/15/15 1140     Chief Complaint  Patient presents with  . Hand Injury   Jared Flowers is a 40 y.o. male who is right hand dominant who presents to the ED complaining of left index and middle finger pain after an injury at work last night. 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 He reports moderate pain and swelling. He reports pain with manipulation of his fingers. He reports some decreased sensation to the tip of his middle finger. He has taken nothing for treatment today. He denies other injury. Tdap was less than 6 months ago.   Patient is a 40 y.o. male presenting with hand injury. The history is provided by the patient. No language interpreter was used.  Hand Injury Associated symptoms: no fever     Past Medical History  Diagnosis Date  . Acid reflux   . Joint pain   . Back pain   . Erosive esophagitis   . Asthma   . Hyperlipidemia   . MRSA (methicillin resistant Staphylococcus aureus)     admitted to hospital 2010  . Hypertension    Past Surgical History  Procedure Laterality Date  . Knee surgery      right knee  . Shoulder surgery     Family History  Problem Relation Age of Onset  . Cancer    . Asthma    . Diabetes    . Kidney disease    . Hypertension Mother   . Cancer Father   . Hypertension Father   . Cancer Sister     breast  . Cancer Brother     lung   Social History  Substance Use Topics  . Smoking status: Current Every Day Smoker -- 0.00 packs/day    Types: Cigarettes  . Smokeless tobacco: Current User  . Alcohol Use: No     Comment: occasional     Review of Systems  Constitutional: Negative for fever.  Musculoskeletal: Positive for joint swelling and arthralgias.  Skin: Positive for wound. Negative for rash.  Neurological: Negative  for numbness.      Allergies  Hydrocodone-acetaminophen and Tramadol  Home Medications   Prior to Admission medications   Medication Sig Start Date End Date Taking? Authorizing Provider  albuterol (PROVENTIL HFA;VENTOLIN HFA) 108 (90 BASE) MCG/ACT inhaler Inhale 1-2 puffs into the lungs every 6 (six) hours as needed for wheezing or shortness of breath. 10/01/13  Yes Babs SciaraScott A Luking, MD  dicyclomine (BENTYL) 20 MG tablet Take 1 tablet (20 mg total) by mouth 2 (two) times daily. Patient not taking: Reported on 11/15/2015 09/20/15   Ace GinsSerena Y Sam, PA-C  gemfibrozil (LOPID) 600 MG tablet Take 1 tablet (600 mg total) by mouth 2 (two) times daily before a meal. Patient not taking: Reported on 09/11/2015 12/31/13   Babs SciaraScott A Luking, MD  methocarbamol (ROBAXIN) 500 MG tablet Take 1 tablet (500 mg total) by mouth 2 (two) times daily. Patient not taking: Reported on 11/15/2015 09/11/15   Melton KrebsSamantha Nicole Riley, PA-C  naproxen (NAPROSYN) 500 MG tablet Take 1 tablet (500 mg total) by mouth 2 (two) times daily with a meal. 11/15/15   Everlene FarrierWilliam Samhitha Rosen, PA-C   BP 128/79 mmHg  Pulse 56  Temp(Src) 98.3 F (36.8  C) (Oral)  Resp 20  Ht  (1.778 m)  Wt 100.245 kg  BMI 31.71 kg/m2  SpO2 99% Physical Exam  Constitutional: He appears well-developed and well-nourished. No distress.  HENT:  Head: Normocephalic and atraumatic.  Eyes: Right eye exhibits no discharge. Left eye exhibits no discharge.  Cardiovascular: Normal rate, regular rhythm and intact distal pulses.   Bilateral radial pulses are intact. Good capillary refill to his left distal fingertips.  Pulmonary/Chest: Effort normal. No respiratory distress.  Musculoskeletal: He exhibits edema and tenderness.  Patient has mild edema and tenderness noted to his left index and middle finger. Pan compartments feel soft. Good capillary refill. Sensation is intact to all of his left distal fingertips. He has some restricted range of motion due to pain in his left  middle finger. No deformity. Small abrasion noted to the palmar aspect of his left index finger.   Neurological: He is alert. Coordination normal.  Sensation is intact to his bilateral distal fingertips.  Skin: No rash noted. He is not diaphoretic.  Psychiatric: He has a normal mood and affect. His behavior is normal.  Nursing note and vitals reviewed.   ED Course  Procedures (including critical care time) Labs Review Labs Reviewed - No data to display  Imaging Review Dg Hand Complete Left  11/15/2015  CLINICAL DATA:  Crush injury of the index and middle finger last evening with generalized swelling of the middle finger with laceration over the PIP joint, diffuse pain over the index finger with swelling as well. EXAM: LEFT HAND - COMPLETE 3+ VIEW COMPARISON:  None in PACs FINDINGS: The bones are adequately mineralized. There is bony density adjacent to the radial aspect of the tuft of the third or long finger which may reflect an avulsion. The interphalangeal and metacarpophalangeal joint spaces are reasonably well-maintained. There is very mild soft tissue swelling diffusely of the index and third fingers. The metacarpals are intact. IMPRESSION: Probable fracture of the medial aspect of the tuft of the distal phalanx of the third finger. The age of this is unclear. There is mild diffuse soft tissue swelling over the index and third fingers. No acute bony abnormality is observed elsewhere. Electronically Signed   By: David  [place] M.D.   On: 11/15/2015 10:06   I have personally reviewed and evaluated these images and lab results as part of my medical decision-making.   EKG Interpretation None      Filed Vitals:   11/15/15 0939 11/15/15 1136 11/15/15 1200 11/15/15 1230  BP: 138/99 128/88 135/94 128/79  Pulse: 72 61 55 56  Temp: 98.2 F (36.8 C) 98.3 F (36.8 C)    TempSrc: Oral Oral    Resp: 18 20    Height:  (1.778 m)     Weight: 100.245 kg     SpO2: 98% 99% 100% 99%     MDM    Meds given in ED:  Medications - No data to display  Discharge Medication List as of 11/15/2015 12:40 PM    START taking these medications   Details  naproxen (NAPROSYN) 500 MG tablet Take 1 tablet (500 mg total) by mouth 2 (two) times daily with a meal., Starting 11/15/2015, Until Discontinued, Print        Final diagnoses:  Distal phalanx or phalanges, closed fracture, initial encounter  Finger pain, left   This is a 40 y.o. male who is right hand dominant who presents to the ED complaining of left index and middle finger pain  after an injury at work last night. 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 Tdap is up to date.  On exam patient has mild swelling noted to his left index and middle finger. Deformity noted. Good capillary refill. Digits are warm.  Sensation is intact. Patient has some decreased range of motion to his left index finger due to pain. He has a small abrasion to his left index finger. Left hand x-ray indicates a probable fracture of the medial aspect of the tuft of the distal phalanx of the 3rd finger. No other acute bony abnormality is observed. Will place in finger splint and have him follow up with Dr. Janee Morn from hand surgery. I advised the patient to follow-up with their primary care provider this week. I advised the patient to return to the emergency department with new or worsening symptoms or new concerns. The patient verbalized understanding and agreement with plan.    This patient was discussed with and evaluated by Dr. Deretha Emory who agrees with assessment and plan.     Everlene Farrier, PA-C 11/15/15 1255

## 2015-11-15 NOTE — Discharge Instructions (Signed)
Finger Fracture °Fractures of fingers are breaks in the bones of the fingers. There are many types of fractures. There are different ways of treating these fractures. Your health care provider will discuss the best way to treat your fracture. °CAUSES °Traumatic injury is the main cause of broken fingers. These include: °· Injuries while playing sports. °· Workplace injuries. °· Falls. °RISK FACTORS °Activities that can increase your risk of finger fractures include: °· Sports. °· Workplace activities that involve machinery. °· A condition called osteoporosis, which can make your bones less dense and cause them to fracture more easily. °SIGNS AND SYMPTOMS °The main symptoms of a broken finger are pain and swelling within 15 minutes after the injury. Other symptoms include: °· Bruising of your finger. °· Stiffness of your finger. °· Numbness of your finger. °· Exposed bones (compound fracture) if the fracture is severe. °DIAGNOSIS  °The best way to diagnose a broken bone is with X-ray imaging. Additionally, your health care provider will use this X-ray image to evaluate the position of the broken finger bones.  °TREATMENT  °Finger fractures can be treated with:  °· Nonreduction--This means the bones are in place. The finger is splinted without changing the positions of the bone pieces. The splint is usually left on for about a week to 10 days. This will depend on your fracture and what your health care provider thinks. °· Closed reduction--The bones are put back into position without using surgery. The finger is then splinted. °· Open reduction and internal fixation--The fracture site is opened. Then the bone pieces are fixed into place with pins or some type of hardware. This is seldom required. It depends on the severity of the fracture. °HOME CARE INSTRUCTIONS  °· Follow your health care provider's instructions regarding activities, exercises, and physical therapy. °· Only take over-the-counter or prescription  medicines for pain, discomfort, or fever as directed by your health care provider. °SEEK MEDICAL CARE IF: °You have pain or swelling that limits the motion or use of your fingers. °SEEK IMMEDIATE MEDICAL CARE IF:  °Your finger becomes numb. °MAKE SURE YOU:  °· Understand these instructions. °· Will watch your condition. °· Will get help right away if you are not doing well or get worse. °  °This information is not intended to replace advice given to you by your health care provider. Make sure you discuss any questions you have with your health care provider. °  °Document Released: 12/08/2000 Document Revised: 06/16/2013 Document Reviewed: 04/07/2013 °Elsevier Interactive Patient Education ©2016 [employer] ° °Cast or Splint Care °Casts and splints support injured limbs and keep bones from moving while they heal. It is important to care for your cast or splint at home.   °HOME CARE INSTRUCTIONS °· Keep the cast or splint uncovered during the drying period. It can take 24 to 48 hours to dry if it is made of plaster. A fiberglass cast will dry in less than 1 hour. °· Do not rest the cast on anything harder than a pillow for the first 24 hours. °· Do not put weight on your injured limb or apply pressure to the cast until your health care provider gives you permission. °· Keep the cast or splint dry. Wet casts or splints can lose their shape and may not support the limb as well. A wet cast that has lost its shape can also create harmful pressure on your skin when it dries. Also, wet skin can become infected. °¨ Cover the cast or splint with   a plastic bag when bathing or when out in the rain or snow. If the cast is on the trunk of the body, take sponge baths until the cast is removed. °¨ If your cast does become wet, dry it with a towel or a blow dryer on the cool setting only. °· Keep your cast or splint clean. Soiled casts may be wiped with a moistened cloth. °· Do not place any hard or soft foreign objects under your  cast or splint, such as cotton, toilet paper, lotion, or powder. °· Do not try to scratch the skin under the cast with any object. The object could get stuck inside the cast. Also, scratching could lead to an infection. If itching is a problem, use a blow dryer on a cool setting to relieve discomfort. °· Do not trim or cut your cast or remove padding from inside of it. °· Exercise all joints next to the injury that are not immobilized by the cast or splint. For example, if you have a long leg cast, exercise the hip joint and toes. If you have an arm cast or splint, exercise the shoulder, elbow, thumb, and fingers. °· Elevate your injured arm or leg on 1 or 2 pillows for the first 1 to 3 days to decrease swelling and pain. It is best if you can comfortably elevate your cast so it is higher than your heart. °SEEK MEDICAL CARE IF:  °· Your cast or splint cracks. °· Your cast or splint is too tight or too loose. °· You have unbearable itching inside the cast. °· Your cast becomes wet or develops a soft spot or area. °· You have a bad smell coming from inside your cast. °· You get an object stuck under your cast. °· Your skin around the cast becomes red or raw. °· You have new pain or worsening pain after the cast has been applied. °SEEK IMMEDIATE MEDICAL CARE IF:  °· You have fluid leaking through the cast. °· You are unable to move your fingers or toes. °· You have discolored (blue or white), cool, painful, or very swollen fingers or toes beyond the cast. °· You have tingling or numbness around the injured area. °· You have severe pain or pressure under the cast. °· You have any difficulty with your breathing or have shortness of breath. °· You have chest pain. °  °This information is not intended to replace advice given to you by your health care provider. Make sure you discuss any questions you have with your health care provider. °  °Document Released: 08/23/2000 Document Revised: 06/16/2013 Document Reviewed:  03/04/2013 °Elsevier Interactive Patient Education ©2016 [employer] ° °

## 2015-11-15 NOTE — ED Notes (Signed)
MD at bedside. 

## 2015-11-17 IMAGING — CR DG SHOULDER 2+V*R*
3 series · 3 of 3 positions shown · non-contrast
Comparison: 02/05/2013

CLINICAL DATA: Right shoulder pain

EXAM:
RIGHT SHOULDER - 2+ VIEW

[view not recorded (1 of 3)]
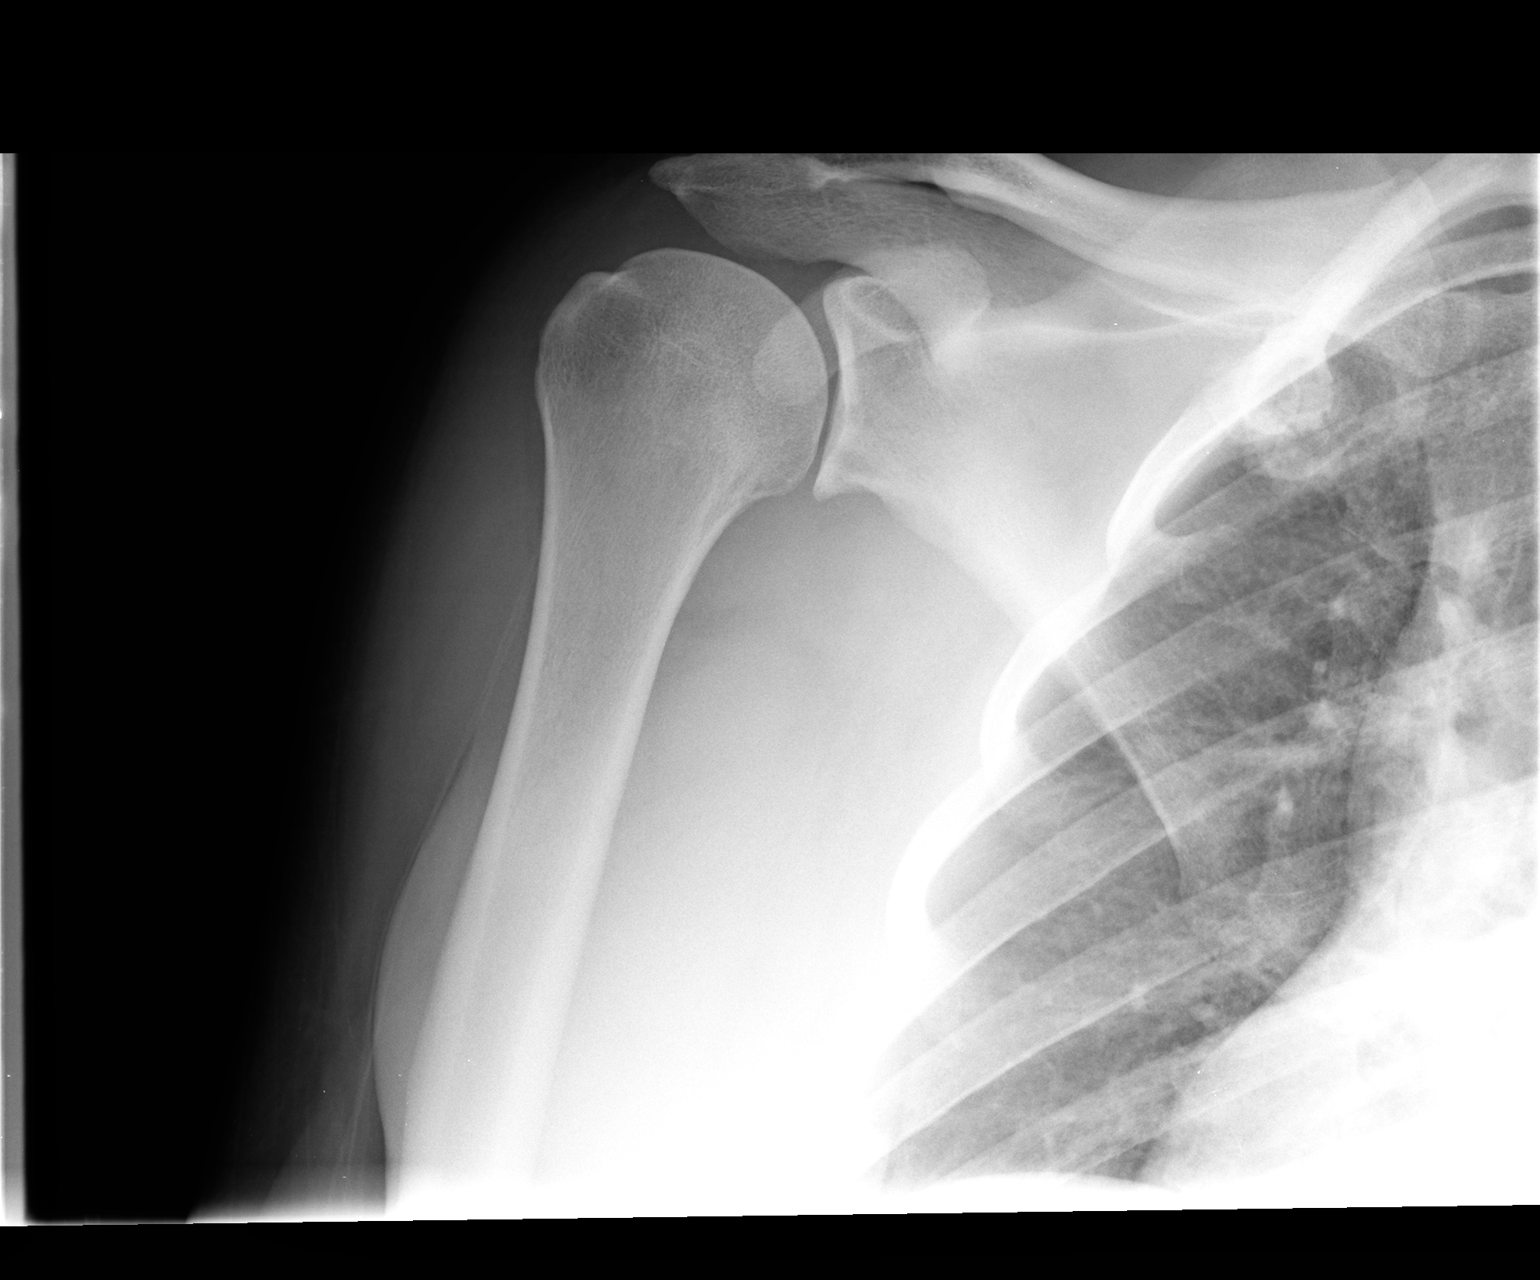

[view not recorded (2 of 3)]
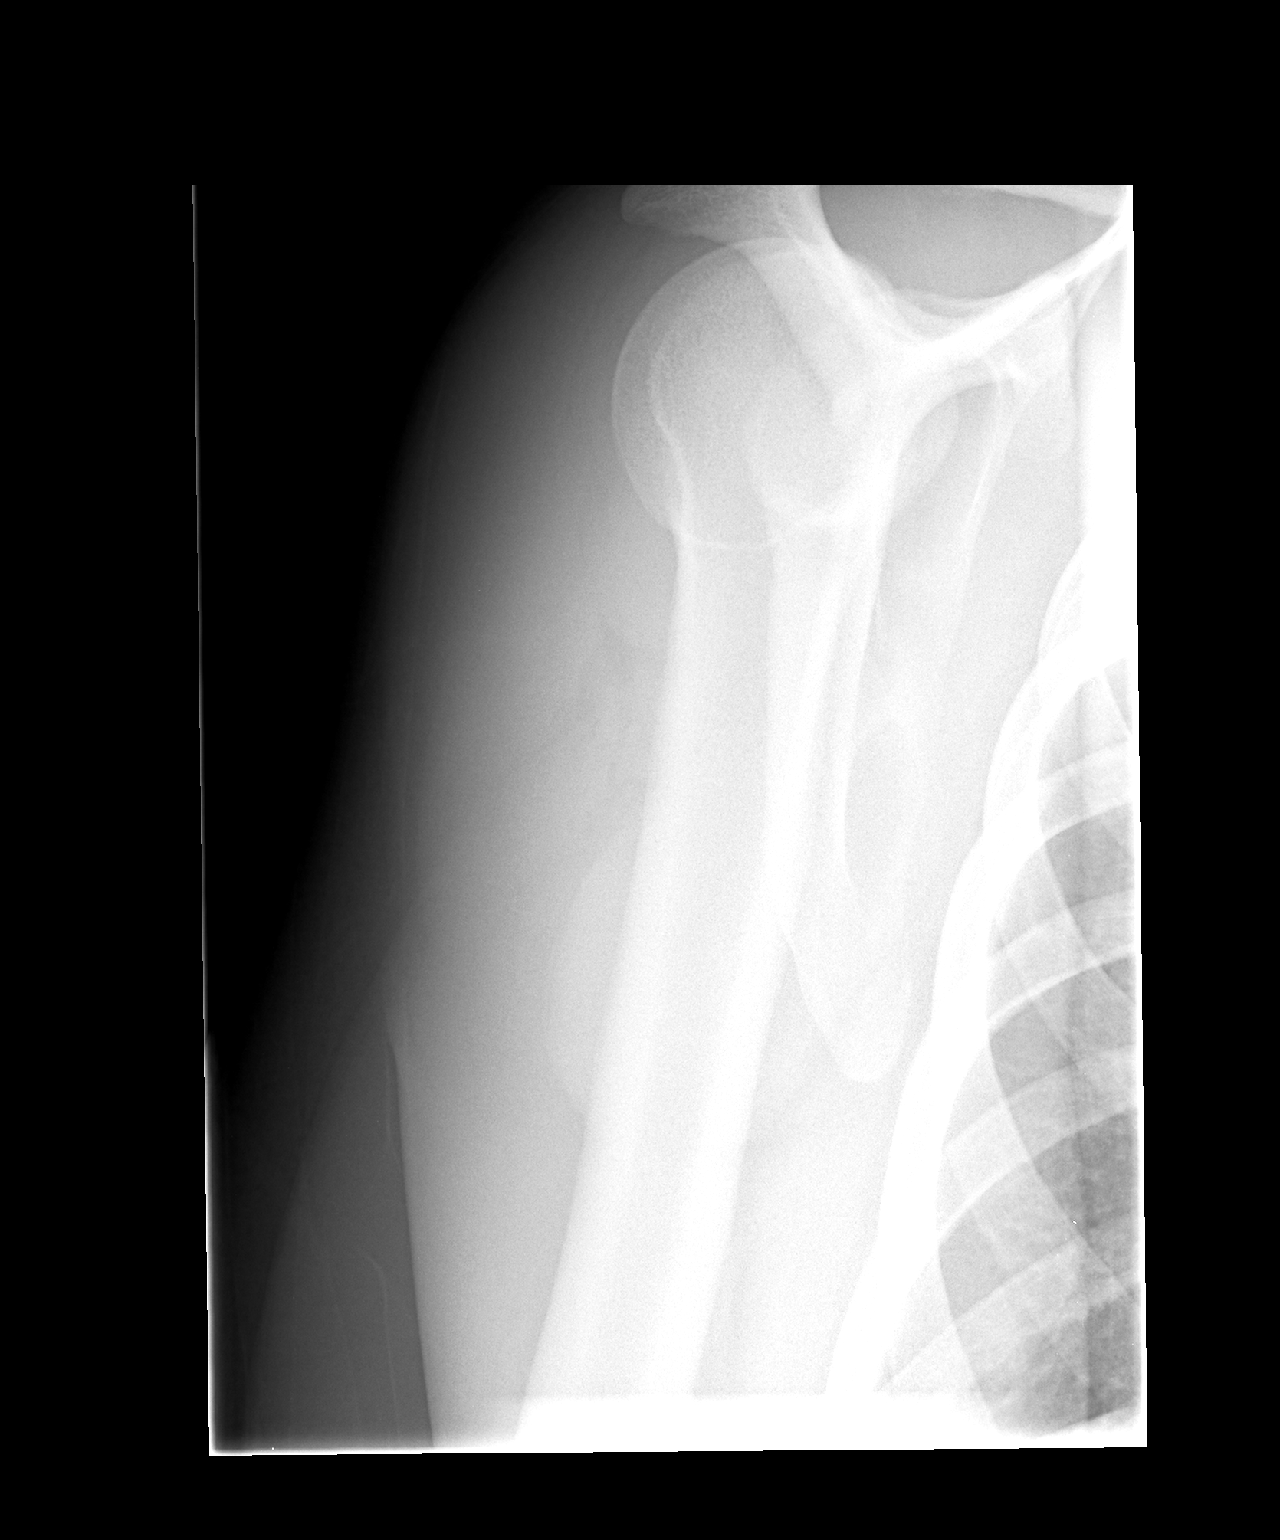

[view not recorded (3 of 3)]
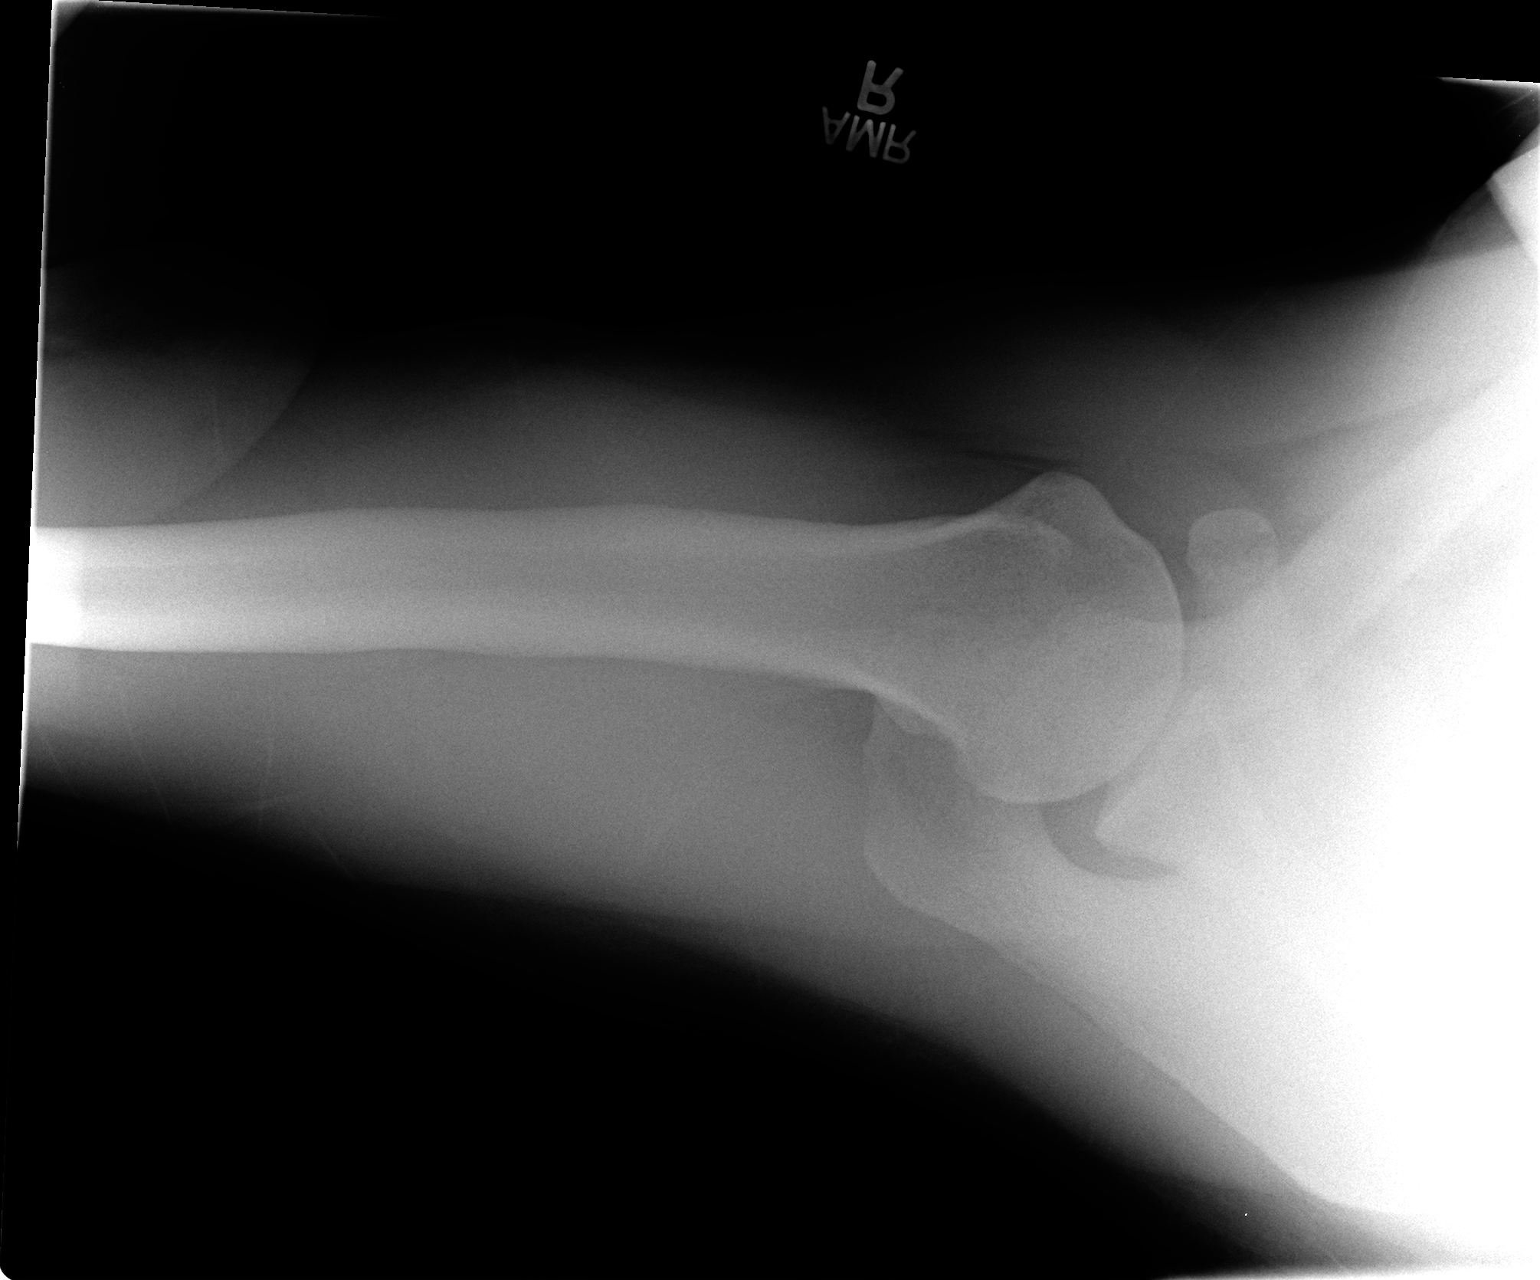

[3 of 3 positions shown; findings below may reference images not displayed]

FINDINGS: There is no evidence of fracture or dislocation. There is no
evidence of arthropathy or other focal bone abnormality. Soft
tissues are unremarkable.
IMPRESSION: No acute osseous finding by plain radiography

## 2015-12-03 ENCOUNTER — Emergency Department (HOSPITAL_COMMUNITY)
Admission: EM | Admit: 2015-12-03 | Discharge: 2015-12-03 | Disposition: A | Discharge status: Home / Self Care | Payer: Medicaid Other | Attending: Emergency Medicine | Admitting: Emergency Medicine

## 2015-12-03 ENCOUNTER — Encounter (HOSPITAL_COMMUNITY): Payer: Self-pay | Admitting: Emergency Medicine

## 2015-12-03 DIAGNOSIS — I10 Essential (primary) hypertension: Secondary | ICD-10-CM | POA: Insufficient documentation

## 2015-12-03 DIAGNOSIS — Z8614 Personal history of Methicillin resistant Staphylococcus aureus infection: Secondary | ICD-10-CM | POA: Insufficient documentation

## 2015-12-03 DIAGNOSIS — Y9389 Activity, other specified: Secondary | ICD-10-CM | POA: Diagnosis not present

## 2015-12-03 DIAGNOSIS — Z8719 Personal history of other diseases of the digestive system: Secondary | ICD-10-CM | POA: Insufficient documentation

## 2015-12-03 DIAGNOSIS — S29002A Unspecified injury of muscle and tendon of back wall of thorax, initial encounter: Secondary | ICD-10-CM | POA: Insufficient documentation

## 2015-12-03 DIAGNOSIS — S3992XA Unspecified injury of lower back, initial encounter: Secondary | ICD-10-CM | POA: Insufficient documentation

## 2015-12-03 DIAGNOSIS — M546 Pain in thoracic spine: Secondary | ICD-10-CM

## 2015-12-03 DIAGNOSIS — Z791 Long term (current) use of non-steroidal anti-inflammatories (NSAID): Secondary | ICD-10-CM | POA: Diagnosis not present

## 2015-12-03 DIAGNOSIS — X500XXA Overexertion from strenuous movement or load, initial encounter: Secondary | ICD-10-CM | POA: Diagnosis not present

## 2015-12-03 DIAGNOSIS — F17210 Nicotine dependence, cigarettes, uncomplicated: Secondary | ICD-10-CM | POA: Insufficient documentation

## 2015-12-03 DIAGNOSIS — Z8639 Personal history of other endocrine, nutritional and metabolic disease: Secondary | ICD-10-CM | POA: Diagnosis not present

## 2015-12-03 DIAGNOSIS — Y990 Civilian activity done for income or pay: Secondary | ICD-10-CM | POA: Insufficient documentation

## 2015-12-03 DIAGNOSIS — Y9289 Other specified places as the place of occurrence of the external cause: Secondary | ICD-10-CM | POA: Insufficient documentation

## 2015-12-03 DIAGNOSIS — Z79899 Other long term (current) drug therapy: Secondary | ICD-10-CM | POA: Insufficient documentation

## 2015-12-03 DIAGNOSIS — J45909 Unspecified asthma, uncomplicated: Secondary | ICD-10-CM | POA: Diagnosis not present

## 2015-12-03 MED ORDER — KETOROLAC TROMETHAMINE 60 MG/2ML IM SOLN
60.0000 mg | Freq: Once | INTRAMUSCULAR | Status: AC
  Administered 2015-12-03: 60 mg via INTRAMUSCULAR
  Filled 2015-12-03: qty 2

## 2015-12-03 MED ORDER — LIDOCAINE 5 % EX PTCH
2.0000 | MEDICATED_PATCH | CUTANEOUS | Status: DC
  Administered 2015-12-03: 2 via TRANSDERMAL
  Filled 2015-12-03: qty 2

## 2015-12-03 MED ORDER — METHOCARBAMOL 500 MG PO TABS
1000.0000 mg | ORAL_TABLET | Freq: Once | ORAL | Status: AC
  Administered 2015-12-03: 1000 mg via ORAL
  Filled 2015-12-03: qty 2

## 2015-12-03 MED ORDER — METHOCARBAMOL 500 MG PO TABS: 500.0000 mg | ORAL_TABLET | Freq: Two times a day (BID) | ORAL | Status: AC

## 2015-12-03 MED ORDER — MELOXICAM 15 MG PO TABS: 15.0000 mg | ORAL_TABLET | Freq: Every day | ORAL | Status: DC

## 2015-12-03 NOTE — Discharge Instructions (Signed)
You have been seen today for back pain. Follow the directions provided to stretch and strength in your back. The Robaxin is a muscle relaxer and should not be used while driving or other dangerous activities. Follow up with PCP as needed if symptoms continue. Return to ED should symptoms worsen.

## 2015-12-03 NOTE — ED Provider Notes (Signed)
CSN: [id number]     Arrival date & time 12/03/15  [id number] History  By signing my name below, I, Jared Flowers, attest that this documentation has been prepared under the direction and in the presence of Jared Eberwein, PA-C. Electronically Signed: Tanda RockersMargaux Flowers, ED Scribe. 12/03/2015. 12:16 PM.   Chief Complaint  Patient presents with  . Back Pain   The history is provided by the patient. No language interpreter was used.     HPI Comments: Jared Flowers is a 40 y.o. male with PMHx chronic back pain who presents to the Emergency Department complaining of sudden onset, constant, 10/10, mid back pain x 2 days. Pt states he was at work and was lifting a heavy object when he felt immediate pain in his back. He has had previous issues with his back but has not seen an orthopedist in awhile. His pain is exacerbated with any type of movement. He has been taking Naproxen and 800 mg Ibuprofen as well as applied heat and ice without relief. Denies abdominal pain, neuro deficits, bowel or bladder changes, falls or trauma, or any other associated symptoms.   Past Medical History  Diagnosis Date  . Acid reflux   . Joint pain   . Back pain   . Erosive esophagitis   . Asthma   . Hyperlipidemia   . MRSA (methicillin resistant Staphylococcus aureus)     admitted to hospital 2010  . Hypertension    Past Surgical History  Procedure Laterality Date  . Knee surgery      right knee  . Shoulder surgery     Family History  Problem Relation Age of Onset  . Cancer    . Asthma    . Diabetes    . Kidney disease    . Hypertension Mother   . Cancer Father   . Hypertension Father   . Cancer Sister     breast  . Cancer Brother     lung   Social History  Substance Use Topics  . Smoking status: Current Every Day Smoker -- 0.00 packs/day    Types: Cigarettes  . Smokeless tobacco: Current User  . Alcohol Use: No     Comment: occasional     Review of Systems  Constitutional: Negative for fever and  chills.  Gastrointestinal: Negative for nausea, vomiting and abdominal pain.  Musculoskeletal: Positive for back pain.  Neurological: Negative for weakness and numbness.    Allergies  Hydrocodone-acetaminophen and Tramadol  Home Medications   Prior to Admission medications   Medication Sig Start Date End Date Taking? Authorizing Provider  albuterol (PROVENTIL HFA;VENTOLIN HFA) 108 (90 BASE) MCG/ACT inhaler Inhale 1-2 puffs into the lungs every 6 (six) hours as needed for wheezing or shortness of breath. 10/01/13   Babs SciaraScott A Luking, MD  meloxicam (MOBIC) 15 MG tablet Take 1 tablet (15 mg total) by mouth daily. 12/03/15   Adyn Hoes C Jameca Chumley, PA-C  methocarbamol (ROBAXIN) 500 MG tablet Take 1 tablet (500 mg total) by mouth 2 (two) times daily. 12/03/15   Ramondo Dietze C Hendrix Yurkovich, PA-C  naproxen (NAPROSYN) 500 MG tablet Take 1 tablet (500 mg total) by mouth 2 (two) times daily with a meal. 11/15/15   Everlene FarrierWilliam Dansie, PA-C   BP 135/96 mmHg  Pulse 62  Temp(Src) 98 F (36.7 C) (Oral)  Resp 18  Ht 5' 9.5" (1.765 m)  Wt 100.387 kg  BMI 32.22 kg/m2  SpO2 99%   Physical Exam  Constitutional: He is oriented to person, place,  and time. He appears well-developed and well-nourished. No distress.  HENT:  Head: Normocephalic and atraumatic.  Eyes: Conjunctivae are normal.  Neck: Neck supple.  Cardiovascular: Normal rate.   Pulmonary/Chest: Effort normal. No respiratory distress.  Musculoskeletal: He exhibits tenderness. He exhibits no edema.  Bilateral tenderness to thoracic and lumbar musculature. Full ROM in all extremities and spine. No paraspinal tenderness.   Neurological: He is alert and oriented to person, place, and time. He has normal reflexes.  No sensory deficits. Strength 5/5 in all extremities. No gait disturbance. Coordination intact.   Skin: Skin is warm and dry.  Psychiatric: He has a normal mood and affect. His behavior is normal.  Nursing note and vitals reviewed.   ED Course  Procedures  (including critical care time)  DIAGNOSTIC STUDIES: Oxygen Saturation is 100% on RA, normal by my interpretation.    COORDINATION OF CARE: 12:15 PM-Discussed treatment plan which includes pain medication and referral to orthopedist with pt at bedside and pt agreed to plan.     MDM   Final diagnoses:  Bilateral thoracic back pain   [name] presents with acute on chronic bilateral thoracic back pain that recurred 2 days ago.  This patient's pain appears to be muscular in nature. Patient has no neuro or functional deficits. No red flag symptoms. Upon reassessment, patient states that his pain has improved significantly and he feels ready for discharge. Patient was encouraged to follow-up with his PCP for further assessment and chronic management. Return precautions discussed. Patient voiced understanding of these instructions and is comfortable with discharge. Patient appears safe for discharge at this time.  I personally performed the services described in this documentation, which was scribed in my presence. The recorded information has been reviewed and is accurate.   Anselm Pancoast, PA-C 12/03/15 1400  Laurence Spates, MD 12/04/15 [phone]

## 2015-12-03 NOTE — ED Notes (Signed)
Pt reports history of chronic back pain. Pt had previous injury to back, reports that he has been here for same multiple time. Pt works in [employer]. Pain is to middle of back. Pt denies urinary problems or loss of bowel control.

## 2015-12-03 NOTE — ED Notes (Signed)
Declined W/C at D/C and was escorted to lobby by RN. 

## 2016-01-15 ENCOUNTER — Encounter (HOSPITAL_COMMUNITY): Payer: Self-pay | Admitting: *Deleted

## 2016-01-15 DIAGNOSIS — Z8614 Personal history of Methicillin resistant Staphylococcus aureus infection: Secondary | ICD-10-CM | POA: Insufficient documentation

## 2016-01-15 DIAGNOSIS — J45909 Unspecified asthma, uncomplicated: Secondary | ICD-10-CM | POA: Diagnosis not present

## 2016-01-15 DIAGNOSIS — F17210 Nicotine dependence, cigarettes, uncomplicated: Secondary | ICD-10-CM

## 2016-01-15 DIAGNOSIS — K920 Hematemesis: Secondary | ICD-10-CM | POA: Insufficient documentation

## 2016-01-15 DIAGNOSIS — Z8719 Personal history of other diseases of the digestive system: Secondary | ICD-10-CM | POA: Insufficient documentation

## 2016-01-15 DIAGNOSIS — R112 Nausea with vomiting, unspecified: Secondary | ICD-10-CM | POA: Diagnosis not present

## 2016-01-15 DIAGNOSIS — G8929 Other chronic pain: Secondary | ICD-10-CM | POA: Diagnosis not present

## 2016-01-15 DIAGNOSIS — I10 Essential (primary) hypertension: Secondary | ICD-10-CM | POA: Insufficient documentation

## 2016-01-15 DIAGNOSIS — Z79899 Other long term (current) drug therapy: Secondary | ICD-10-CM | POA: Diagnosis not present

## 2016-01-15 DIAGNOSIS — R109 Unspecified abdominal pain: Secondary | ICD-10-CM

## 2016-01-15 DIAGNOSIS — R1084 Generalized abdominal pain: Secondary | ICD-10-CM | POA: Diagnosis not present

## 2016-01-15 DIAGNOSIS — Z791 Long term (current) use of non-steroidal anti-inflammatories (NSAID): Secondary | ICD-10-CM | POA: Insufficient documentation

## 2016-01-15 DIAGNOSIS — R195 Other fecal abnormalities: Secondary | ICD-10-CM | POA: Diagnosis not present

## 2016-01-15 DIAGNOSIS — Z8639 Personal history of other endocrine, nutritional and metabolic disease: Secondary | ICD-10-CM | POA: Diagnosis not present

## 2016-01-15 DIAGNOSIS — Z8711 Personal history of peptic ulcer disease: Secondary | ICD-10-CM | POA: Diagnosis not present

## 2016-01-15 LAB — COMPREHENSIVE METABOLIC PANEL
ALT: 21 U/L (ref 17–63)
AST: 18 U/L (ref 15–41)
Albumin: 4.3 g/dL (ref 3.5–5.0)
Alkaline Phosphatase: 63 U/L (ref 38–126)
Anion gap: 12 (ref 5–15)
BUN: 11 mg/dL (ref 6–20)
CO2: 24 mmol/L (ref 22–32)
Calcium: 9.7 mg/dL (ref 8.9–10.3)
Chloride: 103 mmol/L (ref 101–111)
Creatinine, Ser: 0.94 mg/dL (ref 0.61–1.24)
GFR calc Af Amer: >60 mL/min (ref >60)
GFR calc non Af Amer: >60 mL/min (ref >60)
Glucose, Bld: 109 mg/dL — ABNORMAL HIGH (ref 65–99)
Potassium: 3.8 mmol/L (ref 3.5–5.1)
Sodium: 139 mmol/L (ref 135–145)
Total Bilirubin: 0.6 mg/dL (ref 0.3–1.2)
Total Protein: 7.1 g/dL (ref 6.5–8.1)

## 2016-01-15 LAB — CBC
HEMATOCRIT: 37.5 % — AB (ref 39.0–52.0)
HEMOGLOBIN: 12.9 g/dL — AB (ref 13.0–17.0)
MCH: 28.3 pg (ref 26.0–34.0)
MCHC: 34.4 g/dL (ref 30.0–36.0)
MCV: 82.2 fL (ref 78.0–100.0)
Platelets: 213 10*3/uL (ref 150–400)
RBC: 4.56 MIL/uL (ref 4.22–5.81)
RDW: 13.5 % (ref 11.5–15.5)
WBC: 6.4 10*3/uL (ref 4.0–10.5)

## 2016-01-15 LAB — URINALYSIS, ROUTINE W REFLEX MICROSCOPIC
BILIRUBIN URINE: NEGATIVE
GLUCOSE, UA: NEGATIVE mg/dL
HGB URINE DIPSTICK: NEGATIVE
Ketones, ur: NEGATIVE mg/dL
Nitrite: NEGATIVE
PH: 5.5 (ref 5.0–8.0)
Protein, ur: NEGATIVE mg/dL
SPECIFIC GRAVITY, URINE: 1.018 (ref 1.005–1.030)

## 2016-01-15 LAB — URINE MICROSCOPIC-ADD ON

## 2016-01-15 LAB — LIPASE, BLOOD: Lipase: 38 U/L (ref 11–51)

## 2016-01-15 MED ORDER — ONDANSETRON 4 MG PO TBDP
4.0000 mg | ORAL_TABLET | Freq: Once | ORAL | Status: AC | PRN
  Administered 2016-01-15: 4 mg via ORAL

## 2016-01-15 MED ORDER — ONDANSETRON 4 MG PO TBDP
ORAL_TABLET | ORAL | Status: AC
  Filled 2016-01-15: qty 1

## 2016-01-15 NOTE — ED Notes (Signed)
Pt c/o vomiting blood starting around two hours ago. Pt states he has a hx of stomach ulcers. Pt states he sees streaks of blood in his vomit. Denies blood in stool. Pt denies lightheadedness, dizzy, shortness of breath.

## 2016-01-16 ENCOUNTER — Emergency Department (HOSPITAL_COMMUNITY): Payer: Medicaid Other

## 2016-01-16 ENCOUNTER — Emergency Department (HOSPITAL_COMMUNITY)
Admission: EM | Admit: 2016-01-16 | Discharge: 2016-01-16 | Disposition: A | Discharge status: Home / Self Care | Payer: Medicaid Other | Source: Home / Self Care

## 2016-01-16 ENCOUNTER — Encounter (HOSPITAL_COMMUNITY): Payer: Self-pay

## 2016-01-16 ENCOUNTER — Emergency Department (HOSPITAL_COMMUNITY)
Admission: EM | Admit: 2016-01-16 | Discharge: 2016-01-16 | Disposition: A | Discharge status: Home / Self Care | Payer: Medicaid Other | Attending: Emergency Medicine | Admitting: Emergency Medicine

## 2016-01-16 DIAGNOSIS — R1084 Generalized abdominal pain: Secondary | ICD-10-CM

## 2016-01-16 LAB — COMPREHENSIVE METABOLIC PANEL
ALBUMIN: 3.8 g/dL (ref 3.5–5.0)
ALK PHOS: 60 U/L (ref 38–126)
ALT: 18 U/L (ref 17–63)
AST: 19 U/L (ref 15–41)
Anion gap: 9 (ref 5–15)
BILIRUBIN TOTAL: 0.4 mg/dL (ref 0.3–1.2)
BUN: 12 mg/dL (ref 6–20)
CALCIUM: 9.6 mg/dL (ref 8.9–10.3)
CO2: 26 mmol/L (ref 22–32)
CREATININE: 1.1 mg/dL (ref 0.61–1.24)
Chloride: 106 mmol/L (ref 101–111)
GFR calc non Af Amer: >60 mL/min (ref >60)
Glucose, Bld: 155 mg/dL — ABNORMAL HIGH (ref 65–99)
Potassium: 3.9 mmol/L (ref 3.5–5.1)
Sodium: 141 mmol/L (ref 135–145)
Total Protein: 6.7 g/dL (ref 6.5–8.1)

## 2016-01-16 LAB — CBC WITH DIFFERENTIAL/PLATELET
BASOS ABS: 0 10*3/uL (ref 0.0–0.1)
BASOS PCT: 0 %
EOS ABS: 0.1 10*3/uL (ref 0.0–0.7)
EOS PCT: 1 %
HEMATOCRIT: 38.3 % — AB (ref 39.0–52.0)
Hemoglobin: 12.6 g/dL — ABNORMAL LOW (ref 13.0–17.0)
Lymphocytes Relative: 28 %
Lymphs Abs: 1.6 10*3/uL (ref 0.7–4.0)
MCH: 27.1 pg (ref 26.0–34.0)
MCHC: 32.9 g/dL (ref 30.0–36.0)
MCV: 82.4 fL (ref 78.0–100.0)
MONO ABS: 0.3 10*3/uL (ref 0.1–1.0)
MONOS PCT: 6 %
NEUTROS ABS: 3.6 10*3/uL (ref 1.7–7.7)
Neutrophils Relative %: 65 %
PLATELETS: 212 10*3/uL (ref 150–400)
RBC: 4.65 MIL/uL (ref 4.22–5.81)
RDW: 13.6 % (ref 11.5–15.5)
WBC: 5.6 10*3/uL (ref 4.0–10.5)

## 2016-01-16 MED ORDER — GI COCKTAIL ~~LOC~~
30.0000 mL | Freq: Once | ORAL | Status: AC
  Administered 2016-01-16: 30 mL via ORAL
  Filled 2016-01-16: qty 30

## 2016-01-16 MED ORDER — ONDANSETRON HCL 4 MG/2ML IJ SOLN
4.0000 mg | Freq: Once | INTRAMUSCULAR | Status: AC
  Administered 2016-01-16: 4 mg via INTRAVENOUS
  Filled 2016-01-16: qty 2

## 2016-01-16 MED ORDER — MORPHINE SULFATE (PF) 4 MG/ML IV SOLN
4.0000 mg | Freq: Once | INTRAVENOUS | Status: AC
  Administered 2016-01-16: 4 mg via INTRAVENOUS
  Filled 2016-01-16: qty 1

## 2016-01-16 MED ORDER — OMEPRAZOLE 20 MG PO CPDR: DELAYED_RELEASE_CAPSULE | ORAL | Status: AC

## 2016-01-16 MED ORDER — SUCRALFATE 1 G PO TABS: 1.0000 g | ORAL_TABLET | Freq: Three times a day (TID) | ORAL | Status: AC

## 2016-01-16 MED ORDER — PANTOPRAZOLE SODIUM 40 MG IV SOLR
40.0000 mg | Freq: Once | INTRAVENOUS | Status: AC
  Administered 2016-01-16: 40 mg via INTRAVENOUS
  Filled 2016-01-16: qty 40

## 2016-01-16 MED ORDER — OXYCODONE-ACETAMINOPHEN 5-325 MG PO TABS: 1.0000 | ORAL_TABLET | ORAL | Status: AC | PRN

## 2016-01-16 NOTE — ED Notes (Signed)
Pts name called for a room no answer 

## 2016-01-16 NOTE — ED Notes (Signed)
Pt's name called to recheck vitals no answer 

## 2016-01-16 NOTE — Discharge Instructions (Signed)
Please read and follow all provided instructions.  Your diagnoses today include:  1. Generalized abdominal pain     Tests performed today include:  Blood counts and electrolytes  Blood tests to check liver and kidney function  Blood tests to check pancreas function  Urine test to look for infection and pregnancy (in women)  Vital signs. See below for your results today.   Medications prescribed:   Omeprazole (Prilosec) - stomach acid reducer  This medication can be found over-the-counter  Carafate - for stomach upset and to protect your stomach   Percocet (oxycodone/acetaminophen) - narcotic pain medication  DO NOT drive or perform any activities that require you to be awake and alert because this medicine can make you drowsy. BE VERY CAREFUL not to take multiple medicines containing Tylenol (also called acetaminophen). Doing so can lead to an overdose which can damage your liver and cause liver failure and possibly death.  Take any prescribed medications only as directed.  Home care instructions:   Follow any educational materials contained in this packet.  Follow-up instructions: Please follow-up with your primary care provider in the next 2 days for further evaluation of your symptoms. Follow-up with the stomach doctor listed for further evaluation.   Return instructions:  SEEK IMMEDIATE MEDICAL ATTENTION IF:  The pain does not go away or becomes severe   A temperature above 101F develops   Repeated vomiting occurs (multiple episodes)   The pain becomes localized to portions of the abdomen. The right side could possibly be appendicitis. In an adult, the left lower portion of the abdomen could be colitis or diverticulitis.   Blood is being passed in stools or vomit (bright red or black tarry stools)   You develop chest pain, difficulty breathing, dizziness or fainting, or become confused, poorly responsive, or inconsolable (young children)  If you have any  other emergent concerns regarding your health  Additional Information: Abdominal (belly) pain can be caused by many things. Your caregiver performed an examination and possibly ordered blood/urine tests and imaging (CT scan, x-rays, ultrasound). Many cases can be observed and treated at home after initial evaluation in the emergency department. Even though you are being discharged home, abdominal pain can be unpredictable. Therefore, you need a repeated exam if your pain does not resolve, returns, or worsens. Most patients with abdominal pain don't have to be admitted to the hospital or have surgery, but serious problems like appendicitis and gallbladder attacks can start out as nonspecific pain. Many abdominal conditions cannot be diagnosed in one visit, so follow-up evaluations are very important.  Your vital signs today were: BP 134/87 mmHg   Pulse 58   Temp(Src) 98.7 F (37.1 C) (Oral)   Resp 16   SpO2 98% If your blood pressure (bp) was elevated above 135/85 this visit, please have this repeated by your doctor within one month. --------------

## 2016-01-16 NOTE — ED Notes (Signed)
Pt given fluids and tolerating well 

## 2016-01-16 NOTE — ED Notes (Signed)
Pt presents with onset of umbilical pain that began yesterday.  +nausea, vomiting and diarrhea; reports vomiting bright red blood last night.

## 2016-01-16 NOTE — ED Provider Notes (Signed)
CSN: [id number]     Arrival date & time 01/16/16  4098 History   First MD Initiated Contact with Patient 01/16/16 [phone]     Chief Complaint  Patient presents with  . Abdominal Pain   (Consider location/radiation/quality/duration/timing/severity/associated sxs/prior Treatment) HPI Comments: Patient with a history of peptic ulcer disease, chronic shoulder and hip pain for which he takes NSAIDs -- presents with complaint of generalized abdominal pain that worsened at 7:00 last night. Patient had several episodes of vomiting last night noted some streaking of blood in the vomit. Patient came to the emergency department and had labs drawn which are unremarkable, however patient was never seen by a provider because he left due to the long wait. Patient denies changes in his stool, blood in stool or black stools. Patient states that he takes 1600 mg of ibuprofen, 3 times a day for his chronic pains. No CP, SOB, fatigue, lightheadedness. No fever. No previous history of abdominal surgeries. Patient has had a previous colonoscopy and EGD that he states were normal as far as he knows. No current GI doctor. PCP is in [place]. No urine symptoms. Patient denies alcohol use.  Patient is a 40 y.o. male presenting with abdominal pain. The history is provided by the patient.  Abdominal Pain Associated symptoms: nausea and vomiting   Associated symptoms: no chest pain, no constipation, no cough, no diarrhea, no dysuria, no fever, no shortness of breath and no sore throat     Past Medical History  Diagnosis Date  . Acid reflux   . Joint pain   . Back pain   . Erosive esophagitis   . Asthma   . Hyperlipidemia   . MRSA (methicillin resistant Staphylococcus aureus)     admitted to hospital 2010  . Hypertension    Past Surgical History  Procedure Laterality Date  . Knee surgery      right knee  . Shoulder surgery     Family History  Problem Relation Age of Onset  . Cancer    . Asthma    . Diabetes     . Kidney disease    . Hypertension Mother   . Cancer Father   . Hypertension Father   . Cancer Sister     breast  . Cancer Brother     lung   Social History  Substance Use Topics  . Smoking status: Current Every Day Smoker -- 0.00 packs/day    Types: Cigarettes  . Smokeless tobacco: Current User  . Alcohol Use: No     Comment: occasional     Review of Systems  Constitutional: Negative for fever.  HENT: Negative for rhinorrhea and sore throat.   Eyes: Negative for redness.  Respiratory: Negative for cough and shortness of breath.   Cardiovascular: Negative for chest pain.  Gastrointestinal: Positive for nausea, vomiting, abdominal pain and blood in stool. Negative for diarrhea and constipation.  Genitourinary: Negative for dysuria.  Musculoskeletal: Negative for myalgias.  Skin: Negative for rash.  Neurological: Negative for headaches.    Allergies  Hydrocodone-acetaminophen and Tramadol  Home Medications   Prior to Admission medications   Medication Sig Start Date End Date Taking? Authorizing Provider  albuterol (PROVENTIL HFA;VENTOLIN HFA) 108 (90 BASE) MCG/ACT inhaler Inhale 1-2 puffs into the lungs every 6 (six) hours as needed for wheezing or shortness of breath. 10/01/13   Babs Sciara, MD  meloxicam (MOBIC) 15 MG tablet Take 1 tablet (15 mg total) by mouth daily. 12/03/15   Shawn C  Joy, PA-C  methocarbamol (ROBAXIN) 500 MG tablet Take 1 tablet (500 mg total) by mouth 2 (two) times daily. 12/03/15   Shawn C Joy, PA-C  naproxen (NAPROSYN) 500 MG tablet Take 1 tablet (500 mg total) by mouth 2 (two) times daily with a meal. 11/15/15   Everlene Farrier, PA-C   BP 151/97 mmHg  Pulse 66  Temp(Src) 98.7 F (37.1 C) (Oral)  Resp 20  SpO2 99%   Physical Exam  Constitutional: He appears well-developed and well-nourished.  HENT:  Head: Normocephalic and atraumatic.  Eyes: Conjunctivae are normal. Right eye exhibits no discharge. Left eye exhibits no discharge.  Neck:  Normal range of motion. Neck supple.  Cardiovascular: Normal rate, regular rhythm and normal heart sounds.   No murmur heard. Pulmonary/Chest: Effort normal and breath sounds normal. No respiratory distress. He has no wheezes. He has no rales.  Abdominal: Soft. Bowel sounds are normal. He exhibits no distension. There is tenderness (generalized, mild-moderate). There is no rebound and no guarding.  Neurological: He is alert.  Skin: Skin is warm and dry.  Psychiatric: He has a normal mood and affect.  Nursing note and vitals reviewed.   ED Course  Procedures (including critical care time) Labs Review Labs Reviewed  CBC WITH DIFFERENTIAL/PLATELET - Abnormal; Notable for the following:    Hemoglobin 12.6 (*)    HCT 38.3 (*)    All other components within normal limits  COMPREHENSIVE METABOLIC PANEL - Abnormal; Notable for the following:    Glucose, Bld 155 (*)    All other components within normal limits    Imaging Review Dg Abd 2 Views  01/16/2016  CLINICAL DATA:  Lower abdominal pain, bleeding, vomiting EXAM: ABDOMEN - 2 VIEW COMPARISON:  CT abdomen 09/20/2015 FINDINGS: There is no bowel dilatation to suggest obstruction. There is no evidence of pneumoperitoneum, portal venous gas or pneumatosis. There are no pathologic calcifications along the expected course of the ureters. The osseous structures are unremarkable. IMPRESSION: Negative. Electronically Signed   By: Elige Ko   On: 01/16/2016 10:00   I have personally reviewed and evaluated these images and lab results as part of my medical decision-making.  9:40 AM Patient seen and examined. Work-up initiated. Medications ordered. Will recheck CBC and CMP to ensure no evolving process. This visit will most likely be for symptomatic management.   Vital signs reviewed and are as follows: BP 151/97 mmHg  Pulse 66  Temp(Src) 98.7 F (37.1 C) (Oral)  Resp 20  SpO2 99%  11:30 AM Patient is stable, feeling a bit better. Abdomen is  soft on exam. Patient updated on lab and x-ray results. At this time, feel comfortable with discharge to home as patient is tolerating oral fluids. Will give GI referral. Will discharge home on Prilosec, Carafate, pain medication. Patient strongly encouraged to discontinue NSAIDs.  Patient counseled on use of narcotic pain medications. Counseled not to combine these medications with others containing tylenol. Urged not to drink alcohol, drive, or perform any other activities that requires focus while taking these medications. The patient verbalizes understanding and agrees with the plan.  The patient was urged to return to the Emergency Department immediately with worsening of current symptoms, worsening abdominal pain, persistent vomiting, blood noted in stools, fever, or any other concerns. The patient verbalized understanding.    MDM   Final diagnoses:  Generalized abdominal pain  Patient presents with nonfocal abdominal pain, normal white blood cell count, no fever. Generalized tenderness on exam. Patient and vomiting  last night. Symptoms may represent gastritis or peptic ulcer disease given previous history. Patient is a heavy NSAID user. No focal abdominal pain. No indication for CT scanning at this time. Encourage patient follow-up with PCP/GI for further evaluation. Return instructions as above. Vitals are stable, no fever. No signs of dehydration, patient is tolerating PO's. Lungs are clear and no signs suggestive of PNA. Low concern for appendicitis, cholecystitis, pancreatitis, ruptured viscus, UTI, kidney stone, aortic dissection, aortic aneurysm or other emergent abdominal etiology. Supportive therapy indicated with return if symptoms worsen.          Renne CriglerJoshua Emmabelle Fear, PA-C 01/16/16 1137  Marily MemosJason Mesner, MD 01/17/16 1034

## 2016-01-16 NOTE — ED Notes (Signed)
MD at bedside. 

## 2016-02-01 IMAGING — RF DG FLUORO GUIDE NDL PLC/BX
2 series · 2 of 2 positions shown · IV contrast (multihance)
Comparison: none

CLINICAL DATA: Right shoulder pain.  Pre MRI.

EXAM:
RIGHT SHOULDER INJECTION UNDER FLUOROSCOPY
TECHNIQUE: An appropriate skin entrance site was determined. The site was
marked, prepped with Betadine, draped in the usual sterile fashion,
and infiltrated locally with buffered Lidocaine. 22 gauge spinal
needle was advanced to the superomedial margin of the humeral head
under intermittent fluoroscopy. A mixture of 0.05 ml Multihance, 5
cc Omnipaque 300, 5 cc 1% lidocaine was injected without difficulty.
No immediate complication.
FLUOROSCOPY TIME:  1 min and 18 seconds

[Series 1: run · 1 of 1 slices shown (1 of 2)]
[im 1/1]
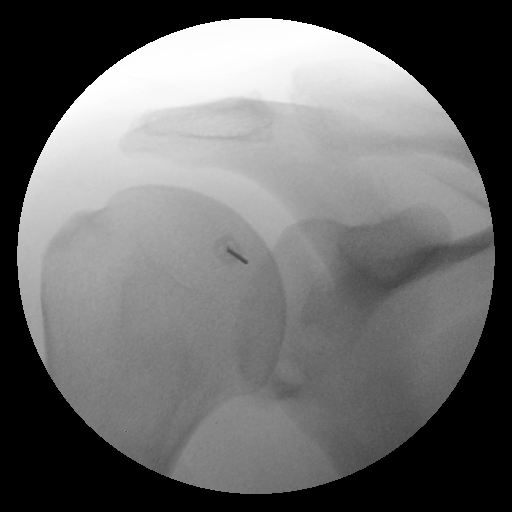

[Series 2: run · 1 of 1 slices shown (2 of 2)]
[im 1/1]
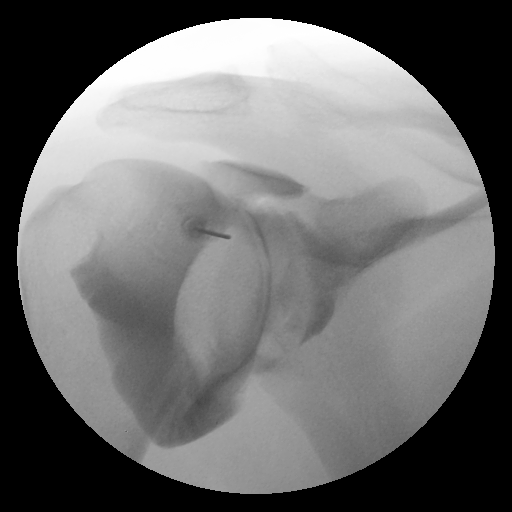

[2 of 2 positions shown; findings below may reference images not displayed]

IMPRESSION: Technically successful right shoulder injection for MRI.

## 2016-03-14 ENCOUNTER — Encounter: Payer: Self-pay | Admitting: Gastroenterology

## 2016-04-19 ENCOUNTER — Telehealth: Payer: Self-pay | Admitting: Gastroenterology

## 2016-04-19 ENCOUNTER — Ambulatory Visit: Payer: BLUE CROSS/BLUE SHIELD | Admitting: Gastroenterology

## 2016-04-19 ENCOUNTER — Encounter: Payer: Self-pay | Admitting: Gastroenterology

## 2016-04-19 NOTE — Telephone Encounter (Signed)
PT WAS A NO SHOW AND LETTER SENT  °

## 2017-06-16 ENCOUNTER — Emergency Department (HOSPITAL_COMMUNITY)
Admission: EM | Admit: 2017-06-16 | Discharge: 2017-06-16 | Disposition: A | Discharge status: Home / Self Care | Payer: Medicaid Other | Attending: Emergency Medicine | Admitting: Emergency Medicine

## 2017-06-16 ENCOUNTER — Encounter (HOSPITAL_COMMUNITY): Payer: Self-pay

## 2017-06-16 DIAGNOSIS — Z79899 Other long term (current) drug therapy: Secondary | ICD-10-CM | POA: Insufficient documentation

## 2017-06-16 DIAGNOSIS — J45909 Unspecified asthma, uncomplicated: Secondary | ICD-10-CM | POA: Insufficient documentation

## 2017-06-16 DIAGNOSIS — B309 Viral conjunctivitis, unspecified: Secondary | ICD-10-CM | POA: Diagnosis not present

## 2017-06-16 DIAGNOSIS — F17210 Nicotine dependence, cigarettes, uncomplicated: Secondary | ICD-10-CM | POA: Diagnosis not present

## 2017-06-16 DIAGNOSIS — H579 Unspecified disorder of eye and adnexa: Secondary | ICD-10-CM | POA: Diagnosis present

## 2017-06-16 DIAGNOSIS — I10 Essential (primary) hypertension: Secondary | ICD-10-CM | POA: Diagnosis not present

## 2017-06-16 MED ORDER — NAPHAZOLINE-PHENIRAMINE 0.025-0.3 % OP SOLN: 1.0000 [drp] | Freq: Four times a day (QID) | OPHTHALMIC | 0 refills | Status: AC | PRN

## 2017-06-16 MED ORDER — FLUORESCEIN SODIUM 1 MG OP STRP
1.0000 | ORAL_STRIP | Freq: Once | OPHTHALMIC | Status: AC
  Administered 2017-06-16: 1 via OPHTHALMIC
  Filled 2017-06-16: qty 1

## 2017-06-16 MED ORDER — TETRACAINE HCL 0.5 % OP SOLN
2.0000 [drp] | Freq: Once | OPHTHALMIC | Status: AC
  Administered 2017-06-16: 2 [drp] via OPHTHALMIC
  Filled 2017-06-16: qty 4

## 2017-06-16 NOTE — ED Triage Notes (Signed)
Pt reports conjunctivitis that began Saturday. Redness noted. Drainage noted as well. Pt denies pain but reports burning at night.
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
## 2017-06-16 NOTE — Discharge Instructions (Signed)
Please read instructions below. This is very contagious so it is important to wash your hands very frequently especially after touching your eyes. You can apply warm compresses to your eyes for relief. You can apply 1 drop of naphazoline eyedrops to each eye, 4 times per day for eye irritation. Follow-up with your ophthalmologist if your symptoms persist or worsen. Return to the ER for vision loss, fever, headache, or new or concerning symptoms.

## 2018-10-18 ENCOUNTER — Encounter (HOSPITAL_COMMUNITY): Payer: Self-pay

## 2018-10-18 ENCOUNTER — Emergency Department (HOSPITAL_COMMUNITY)
Admission: EM | Admit: 2018-10-18 | Discharge: 2018-10-18 | Disposition: A | Discharge status: Home / Self Care | Payer: Medicaid Other | Attending: Emergency Medicine | Admitting: Emergency Medicine

## 2018-10-18 ENCOUNTER — Other Ambulatory Visit: Payer: Self-pay

## 2018-10-18 DIAGNOSIS — J45909 Unspecified asthma, uncomplicated: Secondary | ICD-10-CM | POA: Diagnosis not present

## 2018-10-18 DIAGNOSIS — Z79899 Other long term (current) drug therapy: Secondary | ICD-10-CM | POA: Insufficient documentation

## 2018-10-18 DIAGNOSIS — Z0289 Encounter for other administrative examinations: Secondary | ICD-10-CM | POA: Diagnosis present

## 2018-10-18 DIAGNOSIS — I10 Essential (primary) hypertension: Secondary | ICD-10-CM | POA: Insufficient documentation

## 2018-10-18 DIAGNOSIS — F17210 Nicotine dependence, cigarettes, uncomplicated: Secondary | ICD-10-CM | POA: Insufficient documentation

## 2018-10-18 NOTE — ED Triage Notes (Signed)
Pt was out of work the past 2 days due to fever and generalized fatigue. Pt states he is feeling better and would like a note for work.
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
## 2018-10-18 NOTE — Discharge Instructions (Signed)
Return to the emergency department for new symptoms or any additional concerns.

## 2023-05-14 ENCOUNTER — Telehealth: Payer: Self-pay | Admitting: Orthopaedic Surgery

## 2023-05-14 NOTE — Telephone Encounter (Signed)
Received vm from [facility] @ Dr. Allene Dillon office at [facility] requesting Op Note from Rt Knee scope surgery from 2014. I faxed Op note that I retrieved from [facility] to [phone], ph [phone].
